# Patient Record
Sex: Female | Born: 1965 | Race: White | Hispanic: No | Smoking: Former smoker
Health system: Southern US, Community
[De-identification: ages and names within clinical notes are randomized; demographics above are authoritative.]

## PROBLEM LIST (undated history)

## (undated) DIAGNOSIS — G43909 Migraine, unspecified, not intractable, without status migrainosus: Secondary | ICD-10-CM

## (undated) DIAGNOSIS — B019 Varicella without complication: Secondary | ICD-10-CM

## (undated) DIAGNOSIS — F32A Depression, unspecified: Secondary | ICD-10-CM

## (undated) DIAGNOSIS — F419 Anxiety disorder, unspecified: Secondary | ICD-10-CM

## (undated) HISTORY — DX: Varicella without complication: B01.9

## (undated) HISTORY — PX: BACK SURGERY: SHX140

## (undated) HISTORY — DX: Depression, unspecified: F32.A

## (undated) HISTORY — DX: Migraine, unspecified, not intractable, without status migrainosus: G43.909

## (undated) HISTORY — DX: Anxiety disorder, unspecified: F41.9

## (undated) HISTORY — PX: WISDOM TOOTH EXTRACTION: SHX21

---

## 1988-01-18 HISTORY — PX: APPENDECTOMY: SHX54

## 1998-04-09 ENCOUNTER — Other Ambulatory Visit: Admission: RE | Admit: 1998-04-09 | Discharge: 1998-04-09 | Payer: Self-pay | Admitting: Family Medicine

## 1999-05-12 ENCOUNTER — Other Ambulatory Visit: Admission: RE | Admit: 1999-05-12 | Discharge: 1999-05-12 | Payer: Self-pay | Admitting: Family Medicine

## 2000-11-08 ENCOUNTER — Encounter: Payer: Self-pay | Admitting: Orthopedic Surgery

## 2000-11-08 ENCOUNTER — Encounter: Admission: RE | Admit: 2000-11-08 | Discharge: 2000-11-08 | Payer: Self-pay | Admitting: Orthopedic Surgery

## 2000-11-22 ENCOUNTER — Encounter: Admission: RE | Admit: 2000-11-22 | Discharge: 2000-11-22 | Payer: Self-pay | Admitting: Orthopedic Surgery

## 2000-11-22 ENCOUNTER — Encounter: Payer: Self-pay | Admitting: Orthopedic Surgery

## 2001-01-30 ENCOUNTER — Encounter: Payer: Self-pay | Admitting: Emergency Medicine

## 2001-01-30 ENCOUNTER — Emergency Department (HOSPITAL_COMMUNITY): Admission: EM | Admit: 2001-01-30 | Discharge: 2001-01-30 | Payer: Self-pay | Admitting: Emergency Medicine

## 2002-01-03 ENCOUNTER — Encounter: Admission: RE | Admit: 2002-01-03 | Discharge: 2002-01-03 | Payer: Self-pay | Admitting: Family Medicine

## 2002-01-03 ENCOUNTER — Encounter: Payer: Self-pay | Admitting: Family Medicine

## 2002-11-05 ENCOUNTER — Inpatient Hospital Stay (HOSPITAL_COMMUNITY): Admission: AD | Admit: 2002-11-05 | Discharge: 2002-11-07 | Payer: Self-pay | Admitting: Obstetrics

## 2002-11-05 ENCOUNTER — Inpatient Hospital Stay (HOSPITAL_COMMUNITY): Admission: AD | Admit: 2002-11-05 | Discharge: 2002-11-05 | Payer: Self-pay | Admitting: Obstetrics

## 2017-01-17 HISTORY — PX: FOOT FRACTURE SURGERY: SHX645

## 2017-10-24 ENCOUNTER — Other Ambulatory Visit: Payer: Self-pay | Admitting: Neurosurgery

## 2017-10-24 DIAGNOSIS — M5416 Radiculopathy, lumbar region: Secondary | ICD-10-CM

## 2017-11-02 ENCOUNTER — Ambulatory Visit
Admission: RE | Admit: 2017-11-02 | Discharge: 2017-11-02 | Disposition: A | Discharge status: Home / Self Care | Payer: 59 | Source: Ambulatory Visit | Attending: Neurosurgery | Admitting: Neurosurgery

## 2017-11-02 DIAGNOSIS — M5416 Radiculopathy, lumbar region: Secondary | ICD-10-CM

## 2017-11-02 MED ORDER — IOPAMIDOL (ISOVUE-M 200) INJECTION 41%
1.0000 mL | Freq: Once | INTRAMUSCULAR | Status: AC
  Administered 2017-11-02: 1 mL via EPIDURAL

## 2017-11-02 MED ORDER — METHYLPREDNISOLONE ACETATE 40 MG/ML INJ SUSP (RADIOLOG
120.0000 mg | Freq: Once | INTRAMUSCULAR | Status: AC
  Administered 2017-11-02: 120 mg via EPIDURAL

## 2017-11-02 NOTE — Discharge Instructions (Signed)

## 2019-08-22 ENCOUNTER — Telehealth: Payer: Self-pay | Admitting: General Practice

## 2019-08-22 NOTE — Telephone Encounter (Signed)
New Patient   Caller : Stefany    Per patient, her mother and family are established with you and she would like to establish with you.   Please advise

## 2019-08-23 NOTE — Telephone Encounter (Signed)
Ok

## 2019-08-30 NOTE — Telephone Encounter (Signed)
appt scheduled with pt

## 2019-10-23 ENCOUNTER — Ambulatory Visit: Payer: 59 | Admitting: Family

## 2019-10-28 ENCOUNTER — Other Ambulatory Visit: Payer: Self-pay

## 2019-10-28 ENCOUNTER — Encounter: Payer: Self-pay | Admitting: Family

## 2019-10-28 ENCOUNTER — Ambulatory Visit (INDEPENDENT_AMBULATORY_CARE_PROVIDER_SITE_OTHER): Payer: Managed Care, Other (non HMO) | Admitting: Family

## 2019-10-28 VITALS — BP 110/62 | HR 89 | Resp 16 | Ht 67.5 in | Wt 174.0 lb

## 2019-10-28 DIAGNOSIS — R5383 Other fatigue: Secondary | ICD-10-CM

## 2019-10-28 DIAGNOSIS — E538 Deficiency of other specified B group vitamins: Secondary | ICD-10-CM

## 2019-10-28 DIAGNOSIS — Z0000 Encounter for general adult medical examination without abnormal findings: Secondary | ICD-10-CM | POA: Diagnosis not present

## 2019-10-28 DIAGNOSIS — G43909 Migraine, unspecified, not intractable, without status migrainosus: Secondary | ICD-10-CM

## 2019-10-28 LAB — CBC WITH DIFFERENTIAL/PLATELET
Absolute Monocytes: 525 cells/uL (ref 200–950)
Basophils Absolute: 20 cells/uL (ref 0–200)
Basophils Relative: 0.4 %
Eosinophils Absolute: 170 cells/uL (ref 15–500)
Eosinophils Relative: 3.4 %
HCT: 38.1 % (ref 35.0–45.0)
Hemoglobin: 13.2 g/dL (ref 11.7–15.5)
Lymphs Abs: 1490 cells/uL (ref 850–3900)
MCH: 32.5 pg (ref 27.0–33.0)
MCHC: 34.6 g/dL (ref 32.0–36.0)
MCV: 93.8 fL (ref 80.0–100.0)
MPV: 10.3 fL (ref 7.5–12.5)
Monocytes Relative: 10.5 %
Neutro Abs: 2795 cells/uL (ref 1500–7800)
Neutrophils Relative %: 55.9 %
Platelets: 302 10*3/uL (ref 140–400)
RBC: 4.06 10*6/uL (ref 3.80–5.10)
RDW: 11.9 % (ref 11.0–15.0)
Total Lymphocyte: 29.8 %
WBC: 5 10*3/uL (ref 3.8–10.8)

## 2019-10-28 LAB — COMPREHENSIVE METABOLIC PANEL
AG Ratio: 1.7 (calc) (ref 1.0–2.5)
ALT: 19 U/L (ref 6–29)
AST: 16 U/L (ref 10–35)
Albumin: 4.2 g/dL (ref 3.6–5.1)
Alkaline phosphatase (APISO): 75 U/L (ref 37–153)
BUN: 16 mg/dL (ref 7–25)
CO2: 30 mmol/L (ref 20–32)
Calcium: 9.9 mg/dL (ref 8.6–10.4)
Chloride: 106 mmol/L (ref 98–110)
Creat: 0.83 mg/dL (ref 0.50–1.05)
Globulin: 2.5 g/dL (calc) (ref 1.9–3.7)
Glucose, Bld: 93 mg/dL (ref 65–99)
Potassium: 4.7 mmol/L (ref 3.5–5.3)
Sodium: 142 mmol/L (ref 135–146)
Total Bilirubin: 0.6 mg/dL (ref 0.2–1.2)
Total Protein: 6.7 g/dL (ref 6.1–8.1)

## 2019-10-28 LAB — TSH: TSH: 2.74 mIU/L

## 2019-10-28 LAB — VITAMIN B12: Vitamin B-12: 234 pg/mL (ref 200–1100)

## 2019-10-28 MED ORDER — SUMATRIPTAN SUCCINATE 50 MG PO TABS: ORAL_TABLET | ORAL | 5 refills | Status: DC

## 2019-10-28 NOTE — Progress Notes (Signed)
Subjective:    Patient ID: Susan Glover, female    DOB: 1965/03/05, 54 y.o.   MRN: [id number]  HPI  Patient is a 54 yr old female who presents today to establish care.    Pmhx is significant for the following.   b12 deficiency-  She has been on supplementation in the past but not recently.   Fatigue- she does c/o fatigue.   Migraines- States that her migraines coincide with her menstrual cycle. States that she is perimenopausal (LMP was 2 months ago).  When her headaches occur she often has right sided jaw pain, neck cramping, loss of vision in right eye and then migraine.  Uses 3 excedrin migraine and goes to bed. This usually helps.    Has never been prescribed a triptan for her migraines.  Anxiety/Depression-  Reports that when she was married the first time she developed depression.  Used to be treated with klonopin and xanax.  Reports mild depression symptoms.  No recent panic attacks. Has never been on daily meds for depression.   Review of Systems  Constitutional: Negative for unexpected weight change.  HENT: Negative for hearing loss and rhinorrhea.   Eyes: Negative for visual disturbance.  Respiratory: Negative for cough and shortness of breath.   Cardiovascular: Negative for chest pain.  Gastrointestinal: Positive for constipation (occasional). Negative for diarrhea.  Genitourinary: Negative for dysuria, frequency and hematuria.  Musculoskeletal: Negative for arthralgias and myalgias.  Skin: Negative for rash.  Neurological: Positive for headaches.  Hematological: Negative for adenopathy.  Psychiatric/Behavioral:       Reports mood/depression is stable.        Past Medical History:  Diagnosis Date   Chicken pox    Depression    Migraines      Social History   Socioeconomic History   Marital status: Divorced    Spouse name: Not on file   Number of children: Not on file   Years of education: Not on file   Highest education level: Not on file    Occupational History   Not on file  Tobacco Use   Smoking status: Former Smoker    Start date: 01/27/1985    Quit date: 01/28/2012    Years since quitting: 7.7   Smokeless tobacco: Never Used  Substance and Sexual Activity   Alcohol use: Yes    Comment: 1 drink/day   Drug use: Not Currently   Sexual activity: Yes    Partners: Male  Other Topics Concern   Not on file  Social History Narrative   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)   Has been married and divorced twice, lives with [name]   2 sons   2004- Will   2007- [name]   Completed some college   Enjoys gardening, games, reading   2 cats and 2 dogs, 2 crabs   Social Determinants of Radio broadcast assistant Strain:    Difficulty of Paying Living Expenses: Not on file  Food Insecurity:    Worried About Charity fundraiser in the Last Year: Not on file   [place] in the Last Year: Not on file  Transportation Needs:    Lack of Transportation (Medical): Not on file   Lack of Transportation (Non-Medical): Not on file  Physical Activity:    Days of Exercise per Week: Not on file   Minutes of Exercise per Session: Not on file  Stress:    Feeling of Stress : Not on file  Social Connections:    Frequency of Communication with Friends and Family: Not on file   Frequency of Social Gatherings with Friends and Family: Not on file   Attends Religious Services: Not on Electrical engineer or Organizations: Not on file   Attends Archivist Meetings: Not on file   Marital Status: Not on file  Intimate Partner Violence:    Fear of Current or Ex-Partner: Not on file   Emotionally Abused: Not on file   Physically Abused: Not on file   Sexually Abused: Not on file    Past Surgical History:  Procedure Laterality Date   [place]  2019    Family History  Problem Relation Age of Onset   Asthma Mother    Arthritis Mother     Depression Mother    Alcohol abuse Father    Heart disease Father        MI, brain aneurysm   Hyperlipidemia Father    COPD Father    Arthritis Sister    Depression Sister    Alcohol abuse Brother    Asthma Son    Depression Son    Arthritis Maternal Grandmother    Asthma Maternal Grandmother    COPD Maternal Grandmother    Alcohol abuse Maternal Grandfather    Asthma Maternal Grandfather    COPD Maternal Grandfather    Drug abuse Maternal Grandfather    Hyperlipidemia Maternal Grandfather    Hypertension Maternal Grandfather    Asthma Paternal Grandmother    COPD Paternal Grandmother    Heart attack Paternal Grandfather    Hyperlipidemia Paternal Grandfather    Hypertension Paternal Grandfather    Asthma Son    Depression Son     Allergies  Allergen Reactions   Poison Ivy Extract Dermatitis, Itching and Rash    Current Outpatient Medications on File Prior to Visit  Medication Sig Dispense Refill   aspirin-acetaminophen-caffeine (EXCEDRIN MIGRAINE) 250-250-65 MG tablet Take by mouth every 6 (six) hours as needed for headache.     cyclobenzaprine (FLEXERIL) 10 MG tablet Take 10 mg by mouth 3 (three) times daily.     meloxicam (MOBIC) 15 MG tablet Take 15 mg by mouth daily.     No current facility-administered medications on file prior to visit.    BP 110/62 (BP Location: Left Arm, Patient Position: Sitting, Cuff Size: Normal)    Pulse 89    Resp 16    Ht 5' 7.5" (1.715 m)    Wt 174 lb (78.9 kg)    SpO2 99%    BMI 26.85 kg/m    Objective:   Physical Exam Constitutional:      Appearance: She is well-developed.  HENT:     Right Ear: Tympanic membrane and ear canal normal.     Left Ear: Tympanic membrane and ear canal normal.  Neck:     Thyroid: No thyromegaly.  Cardiovascular:     Rate and Rhythm: Normal rate and regular rhythm.     Heart sounds: Normal heart sounds. No murmur heard.   Pulmonary:     Effort: Pulmonary effort is  normal. No respiratory distress.     Breath sounds: Normal breath sounds. No wheezing.  Musculoskeletal:     Cervical back: Neck supple.  Skin:    General: Skin is warm and dry.  Neurological:     Mental Status: She is alert and oriented to person, place, and time.  Psychiatric:  Behavior: Behavior normal.        Thought Content: Thought content normal.        Judgment: Judgment normal.           Assessment & Plan:  B12 deficiency- will check b12 level.  Fatigue- check CBC, TSH, CMET.  Refer to GI for screening colonoscopy.  Migraines- will give rx for imitrex prn.    36 minutes spent on today's visit.  Time was spent interviewing/examining patient and reviewing medical record.   This visit occurred during the SARS-CoV-2 public health emergency.  Safety protocols were in place, including screening questions prior to the visit, additional usage of staff PPE, and extensive cleaning of exam room while observing appropriate contact time as indicated for disinfecting solutions.

## 2019-10-28 NOTE — Patient Instructions (Signed)
Please complete lab work prior to leaving.  Welcome to [facility]! 

## 2019-10-29 ENCOUNTER — Encounter: Payer: Self-pay | Admitting: Gastroenterology

## 2019-10-29 ENCOUNTER — Telehealth: Payer: Self-pay | Admitting: Family

## 2019-10-29 ENCOUNTER — Encounter: Payer: Self-pay | Admitting: Family

## 2019-10-29 DIAGNOSIS — E538 Deficiency of other specified B group vitamins: Secondary | ICD-10-CM | POA: Insufficient documentation

## 2019-10-29 HISTORY — DX: Deficiency of other specified B group vitamins: E53.8

## 2019-10-29 NOTE — Telephone Encounter (Signed)
Patient advised of results and provider's advise. She was scheduled to come in for first B12 injection 11-05-19 for 1 of 4 weekly. She prefers to schedule the next one when she comes in that day.

## 2019-10-29 NOTE — Telephone Encounter (Signed)
Please advise pt that her b12 is low.  I would recommend that she start b12 injections 1014mcg once daily for 4 weeks, then once monthly.

## 2019-11-05 ENCOUNTER — Ambulatory Visit (INDEPENDENT_AMBULATORY_CARE_PROVIDER_SITE_OTHER): Payer: Managed Care, Other (non HMO)

## 2019-11-05 ENCOUNTER — Other Ambulatory Visit: Payer: Self-pay

## 2019-11-05 DIAGNOSIS — E538 Deficiency of other specified B group vitamins: Secondary | ICD-10-CM

## 2019-11-05 MED ORDER — CYANOCOBALAMIN 1000 MCG/ML IJ SOLN
1000.0000 ug | Freq: Once | INTRAMUSCULAR | Status: AC
  Administered 2019-11-05: 1000 ug via INTRAMUSCULAR

## 2019-11-05 NOTE — Progress Notes (Signed)
Pt here for weekly B12 injection 1 of 4   B12 1081mcg given IM left deltoid, and pt tolerated injection well.  Next B12 injection scheduled for next week

## 2019-11-12 ENCOUNTER — Other Ambulatory Visit: Payer: Self-pay

## 2019-11-12 ENCOUNTER — Ambulatory Visit (INDEPENDENT_AMBULATORY_CARE_PROVIDER_SITE_OTHER): Payer: Managed Care, Other (non HMO)

## 2019-11-12 DIAGNOSIS — E538 Deficiency of other specified B group vitamins: Secondary | ICD-10-CM

## 2019-11-12 MED ORDER — CYANOCOBALAMIN 1000 MCG/ML IJ SOLN
1000.0000 ug | Freq: Once | INTRAMUSCULAR | Status: AC
  Administered 2019-11-12: 1000 ug via INTRAMUSCULAR

## 2019-11-12 NOTE — Progress Notes (Signed)
Pt here for weekly  B12 injection per  B12 1037mcg given in right deltoid IM, and pt tolerated injection well.  Next B12 injection scheduled for next week.

## 2019-11-19 ENCOUNTER — Ambulatory Visit (INDEPENDENT_AMBULATORY_CARE_PROVIDER_SITE_OTHER): Payer: Managed Care, Other (non HMO)

## 2019-11-19 ENCOUNTER — Other Ambulatory Visit: Payer: Self-pay

## 2019-11-19 DIAGNOSIS — E538 Deficiency of other specified B group vitamins: Secondary | ICD-10-CM

## 2019-11-19 MED ORDER — CYANOCOBALAMIN 1000 MCG/ML IJ SOLN
1000.0000 ug | Freq: Once | INTRAMUSCULAR | Status: AC
  Administered 2019-11-19: 1000 ug via INTRAMUSCULAR

## 2019-11-19 NOTE — Progress Notes (Signed)
Pt here for monthly B12 injection per  [name]  B12 1020mcg given IM left deltoid, and pt tolerated injection well.  Next B12 injection scheduled for next week at CPE with pcp.

## 2019-11-26 ENCOUNTER — Other Ambulatory Visit (HOSPITAL_COMMUNITY)
Admission: RE | Admit: 2019-11-26 | Discharge: 2019-11-26 | Disposition: A | Discharge status: Home / Self Care | Payer: Managed Care, Other (non HMO) | Source: Ambulatory Visit | Attending: Family | Admitting: Family

## 2019-11-26 ENCOUNTER — Ambulatory Visit (INDEPENDENT_AMBULATORY_CARE_PROVIDER_SITE_OTHER): Payer: Managed Care, Other (non HMO) | Admitting: Family

## 2019-11-26 ENCOUNTER — Encounter: Payer: Self-pay | Admitting: Family

## 2019-11-26 ENCOUNTER — Other Ambulatory Visit: Payer: Self-pay

## 2019-11-26 VITALS — BP 127/82 | HR 82 | Temp 98.3°F | Resp 16 | Ht 67.5 in | Wt 179.0 lb

## 2019-11-26 DIAGNOSIS — Z0000 Encounter for general adult medical examination without abnormal findings: Secondary | ICD-10-CM | POA: Diagnosis not present

## 2019-11-26 DIAGNOSIS — Z01419 Encounter for gynecological examination (general) (routine) without abnormal findings: Secondary | ICD-10-CM | POA: Insufficient documentation

## 2019-11-26 DIAGNOSIS — Z23 Encounter for immunization: Secondary | ICD-10-CM

## 2019-11-26 DIAGNOSIS — E538 Deficiency of other specified B group vitamins: Secondary | ICD-10-CM

## 2019-11-26 DIAGNOSIS — G43919 Migraine, unspecified, intractable, without status migrainosus: Secondary | ICD-10-CM | POA: Diagnosis not present

## 2019-11-26 MED ORDER — CYANOCOBALAMIN 1000 MCG/ML IJ SOLN
1000.0000 ug | Freq: Once | INTRAMUSCULAR | Status: AC
  Administered 2019-11-26: 1000 ug via INTRAMUSCULAR

## 2019-11-26 NOTE — Progress Notes (Signed)
Subjective:    Patient ID: Susan Glover, female    DOB: [birth year], 54 y.o.   MRN: [id number]  HPI  Patient presents today for complete physical.  Immunizations: flu shot today, unsure of last tetanus shot, declines.  She had pfizer x 2. Last dose in April.   Diet: reports diet is healthy Exercise:  Exercises regularly- pickleball 2-3 times a week, active around the house  Colonoscopy: due- referral was placed last visit.  Pap Smear: due Mammogram: due Vision: up to date (had lasik at the beginning of the year) Dental: up to date  Last visit she noted fatigue. B12 was low normal, CMET, CBC and TSH were normal.    Migraines- last visit we gave her an trial of prn Imitrex. Had one migraine since last visit which was resolved by imitrex.   B12 deficiency- she has been receiving monthly b12 shots.    She has spinal stenosis/sciatica and bulging discs.  Had a ESI injection 1 month ago.  Seeing Dr. Annette Stable and Brien Few.   Review of Systems  Constitutional: Negative for unexpected weight change.  HENT: Negative for hearing loss and rhinorrhea.   Eyes: Negative for visual disturbance.  Respiratory: Negative for cough and shortness of breath.   Cardiovascular: Negative for leg swelling.  Gastrointestinal: Positive for constipation (occasional, stable recently. ). Negative for diarrhea.  Genitourinary: Negative for dysuria, frequency, hematuria and menstrual problem (LMP 3 months ago).  Musculoskeletal: Positive for back pain. Negative for arthralgias and joint swelling.  Skin: Negative for rash.  Neurological: Positive for headaches (occasional).  Hematological: Negative for adenopathy.  Psychiatric/Behavioral:       Reports occasionally feels down but she is doing OK overall   Past Medical History:  Diagnosis Date  . B12 deficiency 10/29/2019  . Chicken pox   . Depression   . Migraines      Social History   Socioeconomic History  . Marital status: Divorced    Spouse name:  Not on file  . Number of children: Not on file  . Years of education: Not on file  . Highest education level: Not on file  Occupational History  . Not on file  Tobacco Use  . Smoking status: Former Smoker    Start date: 01/27/1985    Quit date: 01/28/2012    Years since quitting: 7.8  . Smokeless tobacco: Never Used  Substance and Sexual Activity  . Alcohol use: Yes    Comment: 1 drink/day  . Drug use: Not Currently  . Sexual activity: Yes    Partners: Male  Other Topics Concern  . 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 Difficulty of Paying Living Expenses: Not on file  Food Insecurity:   . Worried About Charity fundraiser in the Last Year: Not on file  . Ran Out of Food in the Last Year: Not on file  Transportation Needs:   . Lack of Transportation (Medical): Not on file  . Lack of Transportation (Non-Medical): Not on file  Physical Activity:   . Days of Exercise per Week: Not on file  . Minutes of Exercise per  Session: Not on file  Stress:   . Feeling of Stress : Not on file  Social Connections:   . Frequency of Communication with Friends and Family: Not on file  . Frequency of Social Gatherings with Friends and Family: Not on file  . Attends Religious Services: Not on file  . Active Member of Clubs or Organizations: Not on file  . Attends Archivist Meetings: Not on file  . Marital Status: Not on file  Intimate Partner Violence:   . Fear of Current or Ex-Partner: Not on file  . Emotionally Abused: Not on file  . Physically Abused: Not on file  . Sexually Abused: Not on file    Past Surgical History:  Procedure Laterality Date  . APPENDECTOMY  1990  . FOOT  FRACTURE SURGERY  2019    Family History  Problem Relation Age of Onset  . Asthma Mother   . Arthritis Mother   . Depression Mother   . Alcohol abuse Father   . Heart disease Father        MI, brain aneurysm  . Hyperlipidemia Father   . COPD Father   . Arthritis Sister   . Depression Sister   . Alcohol abuse Brother   . Asthma Son   . Depression Son   . Arthritis Maternal Grandmother   . Asthma Maternal Grandmother   . COPD Maternal Grandmother   . Alcohol abuse Maternal Grandfather   . Asthma Maternal Grandfather   . COPD Maternal Grandfather   . Drug abuse Maternal Grandfather   . Hyperlipidemia Maternal Grandfather   . Hypertension Maternal Grandfather   . Asthma Paternal Grandmother   . COPD Paternal Grandmother   . Heart attack Paternal Grandfather   . Hyperlipidemia Paternal Grandfather   . Hypertension Paternal Grandfather   . Asthma Son   . Depression Son     Allergies  Allergen Reactions  . Poison Ivy Extract Dermatitis, Itching and Rash    Current Outpatient Medications on File Prior to Visit  Medication Sig Dispense Refill  . meloxicam (MOBIC) 15 MG tablet Take 15 mg by mouth daily.    . SUMAtriptan (IMITREX) 50 MG tablet Take 1 tab by mouth at start of migraine- may repeat in 2 hrs as needed. 10 tablet 5  . aspirin-acetaminophen-caffeine (EXCEDRIN MIGRAINE) 536-644-03 MG tablet Take by mouth every 6 (six) hours as needed for headache.     No current facility-administered medications on file prior to visit.    BP 127/82 (BP Location: Right Arm, Patient Position: Sitting, Cuff Size: Small)   Pulse 82   Temp 98.3 F (36.8 C) (Oral)   Resp 16   Ht 5' 7.5" (1.715 m)   Wt 179 lb (81.2 kg)   SpO2 100%   BMI 27.62 kg/m       Objective:   Physical Exam  Physical Exam  Constitutional: She is oriented to person, place, and time. She appears well-developed and well-nourished. No distress.  HENT:  Head: Normocephalic and atraumatic.  Right Ear:  Tympanic membrane and ear canal normal.  Left Ear: Tympanic membrane and ear canal normal.  Mouth/Throat: not examined- pt wearing mask.  Eyes: Pupils are equal, round, and reactive to light. No scleral icterus.  Neck: Normal range of motion. No thyromegaly present.  Cardiovascular: Normal rate and regular rhythm.   No murmur heard. Pulmonary/Chest: Effort normal and breath sounds normal. No respiratory distress. He has no wheezes. She has no rales. She exhibits no tenderness.  Abdominal: Soft. Bowel sounds are normal. She exhibits no distension and no mass. There is no tenderness. There is no rebound and no guarding.  Musculoskeletal: She exhibits no edema.  Lymphadenopathy:    She has no cervical adenopathy.  Neurological: She is alert and oriented to person, place, and time. She has normal patellar reflexes. She exhibits normal muscle tone. Coordination normal.  Skin: Skin is warm and dry.  Psychiatric: She has a normal mood and affect. Her behavior is normal. Judgment and thought content normal.  Breasts: Examined lying Right: Without masses, retractions, discharge or axillary adenopathy.  Left: Without masses, retractions, discharge or axillary adenopathy.  Inguinal/mons: Normal without inguinal adenopathy  External genitalia: Normal  BUS/Urethra/Skene's glands: Normal  Bladder: Normal  Vagina: Normal  Cervix: Normal  Uterus: normal in size, shape and contour. Midline and mobile  Adnexa/parametria:  Rt: Without masses or tenderness.  Lt: Without masses or tenderness.  Anus and perineum: Normal            Assessment & Plan:   Preventative care- pap performed.  Mammo ordered.  Declines tetanus shot today. Flu shot today. Colo is due- referral in system.   B12 deficiency- b12 injection today. Continue monthly b12 1063mcg injections.  Migraines- stable. Good response to prn imitrex 50mg . Continue same.   This visit occurred during the SARS-CoV-2 public health emergency.   Safety protocols were in place, including screening questions prior to the visit, additional usage of staff PPE, and extensive cleaning of exam room while observing appropriate contact time as indicated for disinfecting solutions.         Assessment & Plan:

## 2019-11-26 NOTE — Patient Instructions (Addendum)
Please complete lab work prior to leaving. Continue to work on healthy diet, exercise and weight loss.  

## 2019-11-27 LAB — CYTOLOGY - PAP
Adequacy: ABSENT
Comment: NEGATIVE
Diagnosis: NEGATIVE
High risk HPV: NEGATIVE

## 2019-11-29 ENCOUNTER — Encounter: Payer: Self-pay | Admitting: Family

## 2019-12-04 NOTE — Progress Notes (Signed)
Mailed out to patient 

## 2019-12-17 ENCOUNTER — Other Ambulatory Visit: Payer: Self-pay

## 2019-12-17 ENCOUNTER — Ambulatory Visit (AMBULATORY_SURGERY_CENTER): Payer: Self-pay

## 2019-12-17 VITALS — Ht 67.5 in | Wt 178.0 lb

## 2019-12-17 DIAGNOSIS — Z1211 Encounter for screening for malignant neoplasm of colon: Secondary | ICD-10-CM

## 2019-12-17 MED ORDER — PLENVU 140 G PO SOLR: 1.0000 | ORAL | 0 refills | Status: DC

## 2019-12-17 NOTE — Progress Notes (Signed)
No egg or soy allergy known to patient  No issues with past sedation with any surgeries or procedures No intubation problems in the past  No FH of Malignant Hyperthermia No diet pills per patient No home 02 use per patient  No blood thinners per patient  Pt denies issues with constipation  No A fib or A flutter  COVID 19 guidelines implemented in [facility] today with Pt and RN  Coupon given to pt in [facility] today , Code to Pharmacy  COVID vaccines completed on 05/2019 per pt;  Due to the COVID-19 pandemic we are asking patients to follow these guidelines. Please only bring one care partner. Please be aware that your care partner may wait in the car in the parking lot or if they feel like they will be too hot to wait in the car, they may wait in the lobby on the 4th floor. All care partners are required to wear a mask the entire time (we do not have any that we can provide them), they need to practice social distancing, and we will do a Covid check for all patient's and care partners when you arrive. Also we will check their temperature and your temperature. If the care partner waits in their car they need to stay in the parking lot the entire time and we will call them on their cell phone when the patient is ready for discharge so they can bring the car to the front of the building. Also all patient's will need to wear a mask into building.

## 2019-12-19 ENCOUNTER — Encounter: Payer: Self-pay | Admitting: Gastroenterology

## 2019-12-25 ENCOUNTER — Other Ambulatory Visit: Payer: Self-pay

## 2019-12-25 ENCOUNTER — Ambulatory Visit (INDEPENDENT_AMBULATORY_CARE_PROVIDER_SITE_OTHER): Payer: Managed Care, Other (non HMO)

## 2019-12-25 DIAGNOSIS — E538 Deficiency of other specified B group vitamins: Secondary | ICD-10-CM

## 2019-12-25 MED ORDER — CYANOCOBALAMIN 1000 MCG/ML IJ SOLN
1000.0000 ug | Freq: Once | INTRAMUSCULAR | Status: AC
  Administered 2019-12-25: 1000 ug via INTRAMUSCULAR

## 2019-12-25 NOTE — Progress Notes (Signed)
Pt here for monthly B12 injection per   B12 1075mcg given IM, and pt tolerated injection well.  Next B12 injection scheduled for 01/22/20

## 2019-12-26 ENCOUNTER — Ambulatory Visit: Payer: Managed Care, Other (non HMO)

## 2020-01-03 ENCOUNTER — Encounter: Payer: Self-pay | Admitting: Gastroenterology

## 2020-01-03 ENCOUNTER — Other Ambulatory Visit: Payer: Self-pay

## 2020-01-03 ENCOUNTER — Ambulatory Visit (AMBULATORY_SURGERY_CENTER): Payer: Managed Care, Other (non HMO) | Admitting: Gastroenterology

## 2020-01-03 VITALS — BP 123/80 | HR 82 | Temp 97.3°F | Resp 20 | Ht 67.5 in | Wt 178.0 lb

## 2020-01-03 DIAGNOSIS — K640 First degree hemorrhoids: Secondary | ICD-10-CM

## 2020-01-03 DIAGNOSIS — Z1211 Encounter for screening for malignant neoplasm of colon: Secondary | ICD-10-CM | POA: Diagnosis present

## 2020-01-03 DIAGNOSIS — D129 Benign neoplasm of anus and anal canal: Secondary | ICD-10-CM

## 2020-01-03 DIAGNOSIS — K621 Rectal polyp: Secondary | ICD-10-CM

## 2020-01-03 MED ORDER — SODIUM CHLORIDE 0.9 % IV SOLN: 500.0000 mL | Freq: Once | INTRAVENOUS | Status: DC

## 2020-01-03 NOTE — Progress Notes (Signed)
VS by CW.  previsit with BG.  Pt states no changes to health hx since previsit 

## 2020-01-03 NOTE — Patient Instructions (Signed)
Discharge instructions given. Handouts on polyps and hemorrhoids. Resume previous medications. YOU HAD AN ENDOSCOPIC PROCEDURE TODAY AT THE [facility]:   Refer to the procedure report that was given to you for any specific questions about what was found during the examination.  If the procedure report does not answer your questions, please call your gastroenterologist to clarify.  If you requested that your care partner not be given the details of your procedure findings, then the procedure report has been included in a sealed envelope for you to review at your convenience later.  YOU SHOULD EXPECT: Some feelings of bloating in the abdomen. Passage of more gas than usual.  Walking can help get rid of the air that was put into your GI tract during the procedure and reduce the bloating. If you had a lower endoscopy (such as a colonoscopy or flexible sigmoidoscopy) you may notice spotting of blood in your stool or on the toilet paper. If you underwent a bowel prep for your procedure, you may not have a normal bowel movement for a few days.  Please Note:  You might notice some irritation and congestion in your nose or some drainage.  This is from the oxygen used during your procedure.  There is no need for concern and it should clear up in a day or so.  SYMPTOMS TO REPORT IMMEDIATELY:  Following lower endoscopy (colonoscopy or flexible sigmoidoscopy):  Excessive amounts of blood in the stool  Significant tenderness or worsening of abdominal pains  Swelling of the abdomen that is new, acute  Fever of 100F or higher   For urgent or emergent issues, a gastroenterologist can be reached at any hour by calling [phone]. Do not use MyChart messaging for urgent concerns.    DIET:  We do recommend a small meal at first, but then you may proceed to your regular diet.  Drink plenty of fluids but you should avoid alcoholic beverages for 24 hours.  ACTIVITY:  You should plan to take it  easy for the rest of today and you should NOT DRIVE or use heavy machinery until tomorrow (because of the sedation medicines used during the test).    FOLLOW UP: Our staff will call the number listed on your records 48-72 hours following your procedure to check on you and address any questions or concerns that you may have regarding the information given to you following your procedure. If we do not reach you, we will leave a message.  We will attempt to reach you two times.  During this call, we will ask if you have developed any symptoms of COVID 19. If you develop any symptoms (ie: fever, flu-like symptoms, shortness of breath, cough etc.) before then, please call [phone].  If you test positive for Covid 19 in the 2 weeks post procedure, please call and report this information to us.    If any biopsies were taken you will be contacted by phone or by letter within the next 1-3 weeks.  Please call us at [phone] if you have not heard about the biopsies in 3 weeks.    SIGNATURES/CONFIDENTIALITY: You and/or your care partner have signed paperwork which will be entered into your electronic medical record.  These signatures attest to the fact that that the information above on your After Visit Summary has been reviewed and is understood.  Full responsibility of the confidentiality of this discharge information lies with you and/or your care-partner.  

## 2020-01-03 NOTE — Op Note (Signed)
[facility] Patient Name: Susan Glover Procedure Date: 01/03/2020 1:25 PM MRN: [id number] Endoscopist: Gerrit Heck , MD Age: 54 Referring MD:  Date of Birth: 11/09/1965 Gender: Female Account #: [id number] Procedure:                Colonoscopy Indications:              Screening for colorectal malignant neoplasm, This                            is the patient's first colonoscopy Medicines:                Monitored Anesthesia Care Procedure:                Pre-Anesthesia Assessment:                           - Prior to the procedure, a History and Physical                            was performed, and patient medications and                            allergies were reviewed. The patient's tolerance of                            previous anesthesia was also reviewed. The risks                            and benefits of the procedure and the sedation                            options and risks were discussed with the patient.                            All questions were answered, and informed consent                            was obtained. Prior Anticoagulants: The patient has                            taken no previous anticoagulant or antiplatelet                            agents. ASA Grade Assessment: II - A patient with                            mild systemic disease. After reviewing the risks                            and benefits, the patient was deemed in                            satisfactory condition to undergo the procedure.  After obtaining informed consent, the colonoscope                            was passed under direct vision. Throughout the                            procedure, the patient's blood pressure, pulse, and                            oxygen saturations were monitored continuously. The                            Olympus CF-HQ190L ([id number]) Colonoscope was                            introduced through the anus  and advanced to the the                            cecum, identified by appendiceal orifice and                            ileocecal valve. The colonoscopy was technically                            difficult and complex due to a tortuous colon.                            Successful completion of the procedure was aided by                            applying abdominal pressure. The patient tolerated                            the procedure well. The quality of the bowel                            preparation was excellent. The ileocecal valve,                            appendiceal orifice, and rectum were photographed. Scope In: 1:57:27 PM Scope Out: 2:16:13 PM Scope Withdrawal Time: 0 hours 11 minutes 49 seconds  Total Procedure Duration: 0 hours 18 minutes 46 seconds  Findings:                 The perianal and digital rectal examinations were                            normal.                           Three sessile polyps were found in the rectum and                            recto-sigmoid colon. The polyps were 2 to 3 mm in  size. These polyps were removed with a cold snare.                            Resection and retrieval were complete. Estimated                            blood loss was minimal.                           Non-bleeding internal hemorrhoids were found during                            retroflexion. The hemorrhoids were small and Grade                            I (internal hemorrhoids that do not prolapse).                           The exam was otherwise normal throughout the                            remainder of the colon. Complications:            No immediate complications. Estimated Blood Loss:     Estimated blood loss was minimal. Impression:               - Three 2 to 3 mm polyps in the rectum and at the                            recto-sigmoid colon, removed with a cold snare.                            Resected and retrieved.                            - Non-bleeding internal hemorrhoids. Recommendation:           - Patient has a contact number available for                            emergencies. The signs and symptoms of potential                            delayed complications were discussed with the                            patient. Return to normal activities tomorrow.                            Written discharge instructions were provided to the                            patient.                           - Resume previous diet.                           -  Continue present medications.                           - Await pathology results.                           - Repeat colonoscopy for surveillance based on                            pathology results.                           - Return to GI office PRN. Gerrit Heck, MD 01/03/2020 2:20:42 PM

## 2020-01-03 NOTE — Progress Notes (Signed)
Called to room to assist during endoscopic procedure.  Patient ID and intended procedure confirmed with present staff. Received instructions for my participation in the procedure from the performing physician.  

## 2020-01-03 NOTE — Progress Notes (Signed)
A and O x3. Report to RN. Tolerated MAC anesthesia well.

## 2020-01-07 ENCOUNTER — Telehealth: Payer: Self-pay | Admitting: *Deleted

## 2020-01-07 NOTE — Telephone Encounter (Signed)
°  Follow up Call-  Call back number 01/03/2020  Post procedure Call Back phone  # [phone]  Permission to leave phone message Yes  Some recent data might be hidden     Patient questions:  Do you have a fever, pain , or abdominal swelling? No. Pain Score  0 *  Have you tolerated food without any problems? Yes.    Have you been able to return to your normal activities? Yes.    Do you have any questions about your discharge instructions: Diet   No. Medications  No. Follow up visit  No.  Do you have questions or concerns about your Care? No.  Actions: * If pain score is 4 or above: No action needed, pain <4.  1. Have you developed a fever since your procedure?no  2.   Have you had an respiratory symptoms (SOB or cough) since your procedure? no  3.   Have you tested positive for COVID 19 since your procedure no  4.   Have you had any family members/close contacts diagnosed with the COVID 19 since your procedure? no   If yes to any of these questions please route to Joylene John, RN and Joella Prince, RN

## 2020-01-22 ENCOUNTER — Ambulatory Visit (INDEPENDENT_AMBULATORY_CARE_PROVIDER_SITE_OTHER): Payer: Managed Care, Other (non HMO)

## 2020-01-22 ENCOUNTER — Other Ambulatory Visit: Payer: Self-pay

## 2020-01-22 ENCOUNTER — Encounter: Payer: Self-pay | Admitting: Gastroenterology

## 2020-01-22 ENCOUNTER — Encounter: Payer: Self-pay | Admitting: Family

## 2020-01-22 DIAGNOSIS — E538 Deficiency of other specified B group vitamins: Secondary | ICD-10-CM

## 2020-01-22 MED ORDER — CYANOCOBALAMIN 1000 MCG/ML IJ SOLN
1000.0000 ug | Freq: Once | INTRAMUSCULAR | Status: AC
  Administered 2020-01-22: 1000 ug via INTRAMUSCULAR

## 2020-01-22 NOTE — Progress Notes (Signed)
Pt is here today for B12 injection. Pt was given B12 injection in left deltoid. Pt tolerated well

## 2020-01-27 ENCOUNTER — Other Ambulatory Visit: Payer: Self-pay

## 2020-01-28 ENCOUNTER — Other Ambulatory Visit: Payer: Self-pay

## 2020-01-28 ENCOUNTER — Encounter: Payer: Self-pay | Admitting: Family

## 2020-01-28 ENCOUNTER — Ambulatory Visit (INDEPENDENT_AMBULATORY_CARE_PROVIDER_SITE_OTHER): Payer: Managed Care, Other (non HMO) | Admitting: Family

## 2020-01-28 VITALS — BP 131/75 | HR 67 | Temp 98.3°F | Resp 16 | Ht 67.5 in | Wt 178.0 lb

## 2020-01-28 DIAGNOSIS — G8929 Other chronic pain: Secondary | ICD-10-CM | POA: Diagnosis not present

## 2020-01-28 DIAGNOSIS — E663 Overweight: Secondary | ICD-10-CM

## 2020-01-28 DIAGNOSIS — Z6827 Body mass index (BMI) 27.0-27.9, adult: Secondary | ICD-10-CM

## 2020-01-28 DIAGNOSIS — M5440 Lumbago with sciatica, unspecified side: Secondary | ICD-10-CM

## 2020-01-28 NOTE — Progress Notes (Signed)
Subjective:    Patient ID: Susan Glover, female    DOB: 03/16/1965, 55 y.o.   MRN: [id number]  HPI  Patient is a 55 yr old female who presents today with two concerns:  Weight gain- She reports that she is very frustrated with her inability to lose weight. Wt Readings from Last 3 Encounters:  01/28/20 178 lb (80.7 kg)  01/03/20 178 lb (80.7 kg)  12/17/19 178 lb (80.7 kg)   She is working with neurosurgery for her sciatica (has an MRI and also is doing some massage, chiropractor, ESI).  She is not getting a lot of relief which she is frustrated by.  We reviewed her typical diet:  Snacks, apple, oranges, smart pop, switching pasta for zuchinni noodles.  Occasional sodas. No juices.    AM- coffee , apple cinnamon oatmeal (today had special K no milk), occasional ham biscuit, omelet on the weekends  Occasional AM snack (cucumbers/popcorn/oranges)  Lunch- eats out once a week (sushi or chipotle- no rice) Usually brings her lunch to work, turkey/swiss on wheat bread occasionally mayo. Cucumbers/apple/oranges/grapes  Dinner- last night clam chowder.  Had chicken enchiladas the night before that.  Had wings the night before that .  Usually pretty healthy (Chicken/pork/vegetables).  Evening snacks so delicious caramel ice cream (non-dairy) small saucer.   Exercise- pickle ball 3 times a week, stretches in the AM's.  Used to run 3.5 miles a day (5 years ago).   Drinks 1 hard lemonade 3 days a week  Review of Systems Past Medical History:  Diagnosis Date  . Anxiety    hx of  . B12 deficiency 10/29/2019  . Chicken pox   . Depression    hx of  . Migraines      Social History   Socioeconomic History  . Marital status: Divorced    Spouse name: Not on file  . Number of children: Not on file  . Years of education: Not on file  . Highest education level: Not on file  Occupational History  . Not on file  Tobacco Use  . Smoking status: Former Smoker    Start date: 01/27/1985     Quit date: 01/28/2012    Years since quitting: 8.0  . Smokeless tobacco: Never Used  Vaping Use  . Vaping Use: Never used  Substance and Sexual Activity  . Alcohol use: Yes    Alcohol/week: 7.0 standard drinks    Types: 7 Standard drinks or equivalent per week    Comment: 1 drink/day  . Drug use: Not Currently  . Sexual activity: Yes    Partners: Male  Other Topics Concern  . 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 APPENDECTOMY  1990  . FOOT FRACTURE SURGERY Bilateral 2019  . WISDOM TOOTH EXTRACTION      Family History  Problem Relation Age of  Onset  . Asthma Mother   . Arthritis Mother   . Depression Mother   . Colon polyps Mother 6  . Alcohol abuse Father   . Heart disease Father        MI, brain aneurysm  . Hyperlipidemia Father   . COPD Father   . Arthritis Sister   . Depression Sister   . Colon polyps Sister 5  . Alcohol abuse Brother   . Asthma Son   . Depression Son   . Arthritis Maternal Grandmother   . Asthma Maternal Grandmother   . COPD Maternal Grandmother   . Alcohol abuse Maternal Grandfather   . Asthma Maternal Grandfather   . COPD Maternal Grandfather   . Drug abuse Maternal Grandfather   . Hyperlipidemia Maternal Grandfather   . Hypertension Maternal Grandfather   . Asthma Paternal Grandmother   . COPD Paternal Grandmother   . Heart attack Paternal Grandfather   . Hyperlipidemia Paternal  Grandfather   . Hypertension Paternal Grandfather   . Asthma Son   . Depression Son   . Colon cancer Neg Hx   . Esophageal cancer Neg Hx   . Rectal cancer Neg Hx   . Stomach cancer Neg Hx     Allergies  Allergen Reactions  . Poison Ivy Extract Dermatitis, Itching and Rash    Current Outpatient Medications on File Prior to Visit  Medication Sig Dispense Refill  . meloxicam (MOBIC) 15 MG tablet Take 15 mg by mouth daily.    . SUMAtriptan (IMITREX) 50 MG tablet Take 1 tab by mouth at start of migraine- may repeat in 2 hrs as needed. 10 tablet 5   No current facility-administered medications on file prior to visit.    BP 131/75 (BP Location: Right Arm, Patient Position: Sitting, Cuff Size: Small)   Pulse 67   Temp 98.3 F (36.8 C) (Oral)   Resp 16   Ht 5' 7.5" (1.715 m)   Wt 178 lb (80.7 kg)   SpO2 99%   BMI 27.47 kg/m       Objective:   Physical Exam Constitutional:      Appearance: She is well-developed and well-nourished.  HENT:     Head: Normocephalic and atraumatic.  Neck:     Thyroid: No thyromegaly.  Pulmonary:     Effort: Pulmonary effort is normal.  Musculoskeletal:     Cervical back: Neck supple.  Neurological:     Mental Status: She is alert and oriented to person, place, and time.  Psychiatric:        Attention and Perception: Attention normal.        Mood and Affect: Mood and affect, mood and affect normal.        Speech: Speech normal.        Behavior: Behavior normal.        Thought Content: Thought content normal.        Judgment: Judgment normal.           Assessment & Plan:  Overweight- we discussed cutting back on snacking, try changing hard lemonade to Truly which has less sugar/calories. Add 2 more days of exercise each week.  Try to incorporate more lean protein and less carbs in her breakfast- such as greek yogurt.  We discussed various weight loss medications- but given her BMI of 27  I think we would have a hard time getting  insurance to cover them.    Chronic back pain- uncontrolled. She is hopeful that weight loss  may help her back pain.  She is currently on meloxicam. She has an upcoming MRI scheduled.  Will defer management to neurosurgery.  40 minutes spent on today's visit. The majority of this time was spent counseling the patient on diet/exercise and weight loss.  This visit occurred during the SARS-CoV-2 public health emergency.  Safety protocols were in place, including screening questions prior to the visit, additional usage of staff PPE, and extensive cleaning of exam room while observing appropriate contact time as indicated for disinfecting solutions.

## 2020-02-25 ENCOUNTER — Encounter: Payer: Self-pay | Admitting: Family

## 2020-02-25 ENCOUNTER — Ambulatory Visit: Payer: Managed Care, Other (non HMO)

## 2020-02-25 ENCOUNTER — Ambulatory Visit (INDEPENDENT_AMBULATORY_CARE_PROVIDER_SITE_OTHER): Payer: Managed Care, Other (non HMO)

## 2020-02-25 ENCOUNTER — Other Ambulatory Visit: Payer: Self-pay

## 2020-02-25 DIAGNOSIS — E538 Deficiency of other specified B group vitamins: Secondary | ICD-10-CM | POA: Diagnosis not present

## 2020-02-25 MED ORDER — CYANOCOBALAMIN 1000 MCG/ML IJ SOLN
1000.0000 ug | Freq: Once | INTRAMUSCULAR | Status: AC
  Administered 2020-02-25: 1000 ug via INTRAMUSCULAR

## 2020-02-25 NOTE — Progress Notes (Signed)
Pt here for monthly B12 injection per [name]  B12 1012mcg given IM, and pt tolerated injection well.  Next B12 injection scheduled for next month.   Patient would like to make pcp aware in case you didn't know-she is having back surgery at [facility] on 03/04/2020

## 2020-03-24 ENCOUNTER — Ambulatory Visit (INDEPENDENT_AMBULATORY_CARE_PROVIDER_SITE_OTHER): Payer: Managed Care, Other (non HMO) | Admitting: *Deleted

## 2020-03-24 ENCOUNTER — Other Ambulatory Visit: Payer: Self-pay

## 2020-03-24 DIAGNOSIS — E538 Deficiency of other specified B group vitamins: Secondary | ICD-10-CM | POA: Diagnosis not present

## 2020-03-24 MED ORDER — CYANOCOBALAMIN 1000 MCG/ML IJ SOLN
1000.0000 ug | Freq: Once | INTRAMUSCULAR | Status: AC
  Administered 2020-03-24: 1000 ug via INTRAMUSCULAR

## 2020-03-24 NOTE — Patient Instructions (Addendum)
Pt here for monthly B12 injection per [name]  B12 1037mcg given Right Deltoid IM, and pt tolerated injection well.  Next B12 injection scheduled for next month.

## 2020-03-24 NOTE — Progress Notes (Signed)
Pt here for monthly B12 injection per [name]  B12 1032mcg given Right Deltoid IM, and pt tolerated injection well.  Next B12 injection scheduled for next month.

## 2020-04-13 ENCOUNTER — Ambulatory Visit (HOSPITAL_BASED_OUTPATIENT_CLINIC_OR_DEPARTMENT_OTHER): Payer: Managed Care, Other (non HMO)

## 2020-04-24 ENCOUNTER — Ambulatory Visit (INDEPENDENT_AMBULATORY_CARE_PROVIDER_SITE_OTHER): Payer: Managed Care, Other (non HMO)

## 2020-04-24 ENCOUNTER — Other Ambulatory Visit: Payer: Self-pay

## 2020-04-24 DIAGNOSIS — E538 Deficiency of other specified B group vitamins: Secondary | ICD-10-CM

## 2020-04-24 MED ORDER — CYANOCOBALAMIN 1000 MCG/ML IJ SOLN
1000.0000 ug | Freq: Once | INTRAMUSCULAR | Status: AC
  Administered 2020-04-24: 1000 ug via INTRAMUSCULAR

## 2020-04-24 NOTE — Progress Notes (Signed)
Pt here for monthly B12 injection per [name] , NP   B12 1043mcg given right  IM, and pt tolerated injection well.  Next B12 injection scheduled for next month

## 2020-05-26 ENCOUNTER — Ambulatory Visit: Payer: Managed Care, Other (non HMO)

## 2020-06-17 ENCOUNTER — Emergency Department (HOSPITAL_BASED_OUTPATIENT_CLINIC_OR_DEPARTMENT_OTHER)
Admission: EM | Admit: 2020-06-17 | Discharge: 2020-06-17 | Disposition: A | Discharge status: Home / Self Care | Payer: Managed Care, Other (non HMO) | Attending: Emergency Medicine | Admitting: Emergency Medicine

## 2020-06-17 ENCOUNTER — Emergency Department (HOSPITAL_BASED_OUTPATIENT_CLINIC_OR_DEPARTMENT_OTHER): Payer: Managed Care, Other (non HMO)

## 2020-06-17 ENCOUNTER — Encounter (HOSPITAL_BASED_OUTPATIENT_CLINIC_OR_DEPARTMENT_OTHER): Payer: Self-pay | Admitting: Urology

## 2020-06-17 ENCOUNTER — Other Ambulatory Visit: Payer: Self-pay

## 2020-06-17 DIAGNOSIS — Z20822 Contact with and (suspected) exposure to covid-19: Secondary | ICD-10-CM | POA: Insufficient documentation

## 2020-06-17 DIAGNOSIS — R110 Nausea: Secondary | ICD-10-CM | POA: Diagnosis not present

## 2020-06-17 DIAGNOSIS — Z87891 Personal history of nicotine dependence: Secondary | ICD-10-CM | POA: Diagnosis not present

## 2020-06-17 DIAGNOSIS — R0602 Shortness of breath: Secondary | ICD-10-CM | POA: Diagnosis present

## 2020-06-17 DIAGNOSIS — J069 Acute upper respiratory infection, unspecified: Secondary | ICD-10-CM | POA: Diagnosis not present

## 2020-06-17 LAB — CBC WITH DIFFERENTIAL/PLATELET
Abs Immature Granulocytes: 0.02 10*3/uL (ref 0.00–0.07)
Basophils Absolute: 0 10*3/uL (ref 0.0–0.1)
Basophils Relative: 1 %
Eosinophils Absolute: 0.2 10*3/uL (ref 0.0–0.5)
Eosinophils Relative: 3 %
HCT: 35.2 % — ABNORMAL LOW (ref 36.0–46.0)
Hemoglobin: 12.3 g/dL (ref 12.0–15.0)
Immature Granulocytes: 0 %
Lymphocytes Relative: 30 %
Lymphs Abs: 1.8 10*3/uL (ref 0.7–4.0)
MCH: 32.9 pg (ref 26.0–34.0)
MCHC: 34.9 g/dL (ref 30.0–36.0)
MCV: 94.1 fL (ref 80.0–100.0)
Monocytes Absolute: 0.4 10*3/uL (ref 0.1–1.0)
Monocytes Relative: 7 %
Neutro Abs: 3.7 10*3/uL (ref 1.7–7.7)
Neutrophils Relative %: 59 %
Platelets: 312 10*3/uL (ref 150–400)
RBC: 3.74 MIL/uL — ABNORMAL LOW (ref 3.87–5.11)
RDW: 13.3 % (ref 11.5–15.5)
WBC: 6.2 10*3/uL (ref 4.0–10.5)
nRBC: 0 % (ref 0.0–0.2)

## 2020-06-17 LAB — COMPREHENSIVE METABOLIC PANEL
ALT: 23 U/L (ref 0–44)
AST: 34 U/L (ref 15–41)
Albumin: 3.7 g/dL (ref 3.5–5.0)
Alkaline Phosphatase: 82 U/L (ref 38–126)
Anion gap: 5 (ref 5–15)
BUN: 10 mg/dL (ref 6–20)
CO2: 24 mmol/L (ref 22–32)
Calcium: 8.7 mg/dL — ABNORMAL LOW (ref 8.9–10.3)
Chloride: 108 mmol/L (ref 98–111)
Creatinine, Ser: 0.8 mg/dL (ref 0.44–1.00)
GFR, Estimated: >60 mL/min (ref >60)
Glucose, Bld: 97 mg/dL (ref 70–99)
Potassium: 4.1 mmol/L (ref 3.5–5.1)
Sodium: 137 mmol/L (ref 135–145)
Total Bilirubin: 0.9 mg/dL (ref 0.3–1.2)
Total Protein: 6.9 g/dL (ref 6.5–8.1)

## 2020-06-17 LAB — URINALYSIS, MICROSCOPIC (REFLEX)

## 2020-06-17 LAB — URINALYSIS, ROUTINE W REFLEX MICROSCOPIC
Bilirubin Urine: NEGATIVE
Glucose, UA: NEGATIVE mg/dL
Hgb urine dipstick: NEGATIVE
Ketones, ur: NEGATIVE mg/dL
Nitrite: NEGATIVE
Protein, ur: NEGATIVE mg/dL
Specific Gravity, Urine: 1.01 (ref 1.005–1.030)
pH: 7 (ref 5.0–8.0)

## 2020-06-17 LAB — RESP PANEL BY RT-PCR (FLU A&B, COVID) ARPGX2
Influenza A by PCR: NEGATIVE
Influenza B by PCR: NEGATIVE
SARS Coronavirus 2 by RT PCR: NEGATIVE

## 2020-06-17 MED ORDER — ONDANSETRON 4 MG PO TBDP: 4.0000 mg | ORAL_TABLET | Freq: Three times a day (TID) | ORAL | 0 refills | Status: DC | PRN

## 2020-06-17 MED ORDER — DEXAMETHASONE SODIUM PHOSPHATE 10 MG/ML IJ SOLN
10.0000 mg | Freq: Once | INTRAMUSCULAR | Status: AC
  Administered 2020-06-17: 10 mg via INTRAMUSCULAR
  Filled 2020-06-17: qty 1

## 2020-06-17 MED ORDER — ONDANSETRON HCL 4 MG/2ML IJ SOLN
4.0000 mg | Freq: Once | INTRAMUSCULAR | Status: AC
  Administered 2020-06-17: 4 mg via INTRAVENOUS
  Filled 2020-06-17: qty 2

## 2020-06-17 MED ORDER — AEROCHAMBER PLUS FLO-VU MEDIUM MISC
1.0000 | Freq: Once | Status: AC
  Administered 2020-06-17: 1
  Filled 2020-06-17: qty 1

## 2020-06-17 MED ORDER — ALBUTEROL SULFATE HFA 108 (90 BASE) MCG/ACT IN AERS
2.0000 | INHALATION_SPRAY | Freq: Once | RESPIRATORY_TRACT | Status: AC
  Administered 2020-06-17: 2 via RESPIRATORY_TRACT
  Filled 2020-06-17: qty 6.7

## 2020-06-17 NOTE — ED Notes (Signed)
ED Provider at bedside. 

## 2020-06-17 NOTE — ED Triage Notes (Signed)
Pt states SOB and chest tightness since Sunday, states "feels like hairball in my chest".

## 2020-06-17 NOTE — ED Provider Notes (Signed)
[facility] EMERGENCY DEPARTMENT Provider Note   CSN: [id number] Arrival date & time: 06/17/20  1801     History Chief Complaint  Patient presents with  . Shortness of Breath    Susan Glover is a 55 y.o. female.  Pt presents to the ED today with sob.  Pt said she feels like she has a "hairball" in her chest.  Pt has been vaccinated against Covid, but several people at work have [place].  Last night, she used her son's inhaler before going to bed because she could not breathe.  She also feels nauseous when she tries to eat.  She denies any f/c.          Past Medical History:  Diagnosis Date  . Anxiety    hx of  . B12 deficiency 10/29/2019  . Chicken pox   . Depression    hx of  . Migraines     Patient Active Problem List   Diagnosis Date Noted  . B12 deficiency 10/29/2019    Past Surgical History:  Procedure Laterality Date  . APPENDECTOMY  1990  . BACK SURGERY    . FOOT FRACTURE SURGERY Bilateral 2019  . WISDOM TOOTH EXTRACTION       OB History   No obstetric history on file.     Family History  Problem Relation Age of Onset  . Asthma Mother   . Arthritis Mother   . Depression Mother   . Colon polyps Mother 81  . Alcohol abuse Father   . Heart disease Father        MI, brain aneurysm  . Hyperlipidemia Father   . COPD Father   . Arthritis Sister   . Depression Sister   . Colon polyps Sister 50  . Alcohol abuse Brother   . Asthma Son   . Depression Son   . Arthritis Maternal Grandmother   . Asthma Maternal Grandmother   . COPD Maternal Grandmother   . Alcohol abuse Maternal Grandfather   . Asthma Maternal Grandfather   . COPD Maternal Grandfather   . Drug abuse Maternal Grandfather   . Hyperlipidemia Maternal Grandfather   . Hypertension Maternal Grandfather   . Asthma Paternal Grandmother   . COPD Paternal Grandmother   . Heart attack Paternal Grandfather   . Hyperlipidemia Paternal Grandfather   . Hypertension Paternal  Grandfather   . Asthma Son   . Depression Son   . Colon cancer Neg Hx   . Esophageal cancer Neg Hx   . Rectal cancer Neg Hx   . Stomach cancer Neg Hx     Social History   Tobacco Use  . Smoking status: Former Smoker    Start date: 01/27/1985    Quit date: 01/28/2012    Years since quitting: 8.3  . Smokeless tobacco: Never Used  Vaping Use  . Vaping Use: Never used  Substance Use Topics  . Alcohol use: Yes    Alcohol/week: 7.0 standard drinks    Types: 7 Standard drinks or equivalent per week    Comment: 1 drink/day  . Drug use: Not Currently    Home Medications Prior to Admission medications   Medication Sig Start Date End Date Taking? Authorizing Provider  ondansetron (ZOFRAN ODT) 4 MG disintegrating tablet Take 1 tablet (4 mg total) by mouth every 8 (eight) hours as needed for nausea or vomiting. 06/17/20  Yes Isla Pence, MD  meloxicam (MOBIC) 15 MG tablet Take 15 mg by mouth daily. 10/07/19  [provider]  SUMAtriptan (IMITREX) 50 MG tablet Take 1 tab by mouth at start of migraine- may repeat in 2 hrs as needed. 10/28/19   [name], NP    Allergies    Poison ivy extract  Review of Systems   Review of Systems  Respiratory: Positive for shortness of breath.   All other systems reviewed and are negative.   Physical Exam Updated Vital Signs BP 132/78 (BP Location: Left Arm)   Pulse 84   Temp 98.1 F (36.7 C) (Oral)   Resp 16   Ht 5\' 7"  (1.702 m)   Wt 79.4 kg   SpO2 98%   BMI 27.41 kg/m   Physical Exam Vitals and nursing note reviewed.  Constitutional:      Appearance: She is well-developed.  HENT:     Head: Normocephalic and atraumatic.     Mouth/Throat:     Mouth: Mucous membranes are moist.     Pharynx: Oropharynx is clear.  Eyes:     Extraocular Movements: Extraocular movements intact.     Pupils: Pupils are equal, round, and reactive to light.  Cardiovascular:     Rate and Rhythm: Normal rate and regular rhythm.   Pulmonary:     Effort: Pulmonary effort is normal.     Breath sounds: Wheezing present.  Abdominal:     General: Bowel sounds are normal.     Palpations: Abdomen is soft.  Musculoskeletal:        General: Normal range of motion.     Cervical back: Normal range of motion and neck supple.  Skin:    General: Skin is warm.     Capillary Refill: Capillary refill takes less than 2 seconds.  Neurological:     General: No focal deficit present.     Mental Status: She is alert and oriented to person, place, and time.  Psychiatric:        Mood and Affect: Mood normal.        Behavior: Behavior normal.     ED Results / Procedures / Treatments   Labs (all labs ordered are listed, but only abnormal results are displayed) Labs Reviewed  COMPREHENSIVE METABOLIC PANEL - Abnormal; Notable for the following components:      Result Value   Calcium 8.7 (*)    All other components within normal limits  CBC WITH DIFFERENTIAL/PLATELET - Abnormal; Notable for the following components:   RBC 3.74 (*)    HCT 35.2 (*)    All other components within normal limits  URINALYSIS, ROUTINE W REFLEX MICROSCOPIC - Abnormal; Notable for the following components:   Leukocytes,Ua MODERATE (*)    All other components within normal limits  URINALYSIS, MICROSCOPIC (REFLEX) - Abnormal; Notable for the following components:   Bacteria, UA MANY (*)    All other components within normal limits  RESP PANEL BY RT-PCR (FLU A&B, COVID) ARPGX2    EKG None  Radiology DG Chest Portable 1 View  Result Date: 06/17/2020 CLINICAL DATA:  Shortness of breath and chest tightness since Sunday, feels like a hair bowel in my chest EXAM: PORTABLE CHEST 1 VIEW COMPARISON:  Portable exam 1850 hours without priors for comparison FINDINGS: Normal heart size, mediastinal contours, and pulmonary vascularity. Lungs clear. No pulmonary infiltrate, pleural effusion, or pneumothorax. Osseous structures unremarkable. IMPRESSION: No acute  abnormalities. Electronically Signed   By: Lavonia Dana M.D.   On: 06/17/2020 19:09    Procedures Procedures   Medications Ordered in ED Medications  dexamethasone (  DECADRON) injection 10 mg (10 mg Intramuscular Given 06/17/20 1857)  ondansetron (ZOFRAN) injection 4 mg (4 mg Intravenous Given 06/17/20 2211)  albuterol (VENTOLIN HFA) 108 (90 Base) MCG/ACT inhaler 2 puff (2 puffs Inhalation Given 06/17/20 2138)  AeroChamber Plus Flo-Vu Medium MISC 1 each (1 each Other Given 06/17/20 2138)    ED Course  I have reviewed the triage vital signs and the nursing notes.  Pertinent labs & imaging results that were available during my care of the patient were reviewed by me and considered in my medical decision making (see chart for details).    MDM Rules/Calculators/A&P                           Covid neg.  She is feeling a little nauseous, so she is given zofran.  She is able to tolerate po fluids.  Labs otherwise unremarkable.  Pt is stable for d/c.  Return if worse.  MAKEBA DELCASTILLO was evaluated in Emergency Department on 06/17/2020 for the symptoms described in the history of present illness. She was evaluated in the context of the global COVID-19 pandemic, which necessitated consideration that the patient might be at risk for infection with the SARS-CoV-2 virus that causes COVID-19. Institutional protocols and algorithms that pertain to the evaluation of patients at risk for COVID-19 are in a state of rapid change based on information released by regulatory bodies including the CDC and federal and state organizations. These policies and algorithms were followed during the patient's care in the ED. Final Clinical Impression(s) / ED Diagnoses Final diagnoses:  Viral upper respiratory tract infection  Nausea    Rx / DC Orders ED Discharge Orders         Ordered    ondansetron (ZOFRAN ODT) 4 MG disintegrating tablet  Every 8 hours PRN        06/17/20 2133           Isla Pence, MD 06/17/20  2309

## 2020-06-18 ENCOUNTER — Telehealth: Payer: Self-pay | Admitting: *Deleted

## 2020-06-18 NOTE — Telephone Encounter (Signed)
Patient went to ED

## 2020-06-18 NOTE — Telephone Encounter (Signed)
Who Is Calling Patient / Member / Family / Caregiver Call Type Triage / Clinical Relationship To Patient Self Return Phone Number [phone] (Primary) Chief Complaint BREATHING - shortness of breath or sounds breathless Reason for Call Symptomatic / Request for Health Information Initial Comment Caller states she is experiencing shortness of breath and feels like there is a cotton ball in her throat. She does get nauseated when she tries to eat. [place] Not Listed ER downstairs from the office. Translation No Nurse Assessment Nurse: Laqueta Due, RN, Metallurgist (Eastern Time): 06/17/2020 3:53:17 PM Confirm and document reason for call. If symptomatic, describe symptoms. ---pt having SOB and feels like cotton ball in throat. when eats gets nausea. pt coughed while nurse speaking introduction

## 2020-06-25 ENCOUNTER — Other Ambulatory Visit: Payer: Self-pay

## 2020-06-25 ENCOUNTER — Ambulatory Visit (INDEPENDENT_AMBULATORY_CARE_PROVIDER_SITE_OTHER): Payer: Managed Care, Other (non HMO)

## 2020-06-25 DIAGNOSIS — E538 Deficiency of other specified B group vitamins: Secondary | ICD-10-CM | POA: Diagnosis not present

## 2020-06-25 MED ORDER — CYANOCOBALAMIN 1000 MCG/ML IJ SOLN
1000.0000 ug | Freq: Once | INTRAMUSCULAR | Status: AC
  Administered 2020-06-25: 1000 ug via INTRAMUSCULAR

## 2020-06-25 NOTE — Progress Notes (Signed)
Susan Glover is a 55 y.o. female presents to the office today for Cyanocobalamin injections, per physician's orders. Original order: per [name] FNP.  Cyanocobalamin (med), 1000 mg/m. (dose),  IM (route) was administered left arm (location) today. Patient tolerated injection.  Patient next injection due: in one month, appt made for 07-24-2020.   Jiles Prows

## 2020-07-14 ENCOUNTER — Encounter: Payer: Self-pay | Admitting: Family

## 2020-07-14 DIAGNOSIS — N912 Amenorrhea, unspecified: Secondary | ICD-10-CM

## 2020-07-14 DIAGNOSIS — E538 Deficiency of other specified B group vitamins: Secondary | ICD-10-CM

## 2020-07-24 ENCOUNTER — Other Ambulatory Visit: Payer: Self-pay

## 2020-07-24 ENCOUNTER — Ambulatory Visit (INDEPENDENT_AMBULATORY_CARE_PROVIDER_SITE_OTHER): Payer: Managed Care, Other (non HMO) | Admitting: Family

## 2020-07-24 VITALS — BP 123/79 | HR 68 | Temp 98.7°F | Resp 16 | Wt 181.0 lb

## 2020-07-24 DIAGNOSIS — R41840 Attention and concentration deficit: Secondary | ICD-10-CM | POA: Diagnosis not present

## 2020-07-24 DIAGNOSIS — E538 Deficiency of other specified B group vitamins: Secondary | ICD-10-CM | POA: Diagnosis not present

## 2020-07-24 DIAGNOSIS — K219 Gastro-esophageal reflux disease without esophagitis: Secondary | ICD-10-CM

## 2020-07-24 MED ORDER — CYANOCOBALAMIN 1000 MCG/ML IJ SOLN
1000.0000 ug | Freq: Once | INTRAMUSCULAR | Status: AC
  Administered 2020-07-24: 1000 ug via INTRAMUSCULAR

## 2020-07-24 NOTE — Patient Instructions (Addendum)
Please stop meloxicam. Start pantoprazole 40mg  once daily for reflux.   For ADHD evaluation you can try scheduling an evaluation at the following locations:  [facility] [phone] [facility] [phone]  You can also try [facility] in [phone]

## 2020-07-24 NOTE — Progress Notes (Signed)
Subjective:   By signing my name below, I, Shehryar Baig, attest that this documentation has been prepared under the direction and in the presence of [name]. 07/24/2020    Patient ID: Susan Glover, female    DOB: Jan 26, 1965, 55 y.o.   MRN: [id number]  Chief Complaint  Patient presents with   Gastroesophageal Reflux    Complains of acid reflux    Nausea    Complains of nausea after eating   Bloated    Feeling bloated     HPI Patient is in today for a office visit.  GERD- She went to the ER last month because of SOB, feeling something is stuck in throat, constant feeling of wanting to vomit, and abdominal pain. She was given anti-nausea medication to manage her symptoms but found no relief. She was also given an inhaler to use daily but finds mild relief. She does not have abdominal pain at this time. After her visit she started using OTC prevacid to manage her symptoms and found great relief. She continues using 15 mg meloxicam daily PO at this time. ADD- During her last job she would sit down and be overwhelmed and could not concentrate on one thing consistently. She complains at her new job she loses focus easily on some days. She was not treated for ADD as a child. She prefers to get tested near her home in [place].  Health Maintenance Due  Topic Date Due   HIV Screening  Never done   Hepatitis C Screening  Never done   MAMMOGRAM  Never done   Zoster Vaccines- Shingrix (1 of 2) Never done   COVID-19 Vaccine (4 - Booster for [employer] series) 07/17/2020    Past Medical History:  Diagnosis Date   Anxiety    hx of   B12 deficiency 10/29/2019   Chicken pox    Depression    hx of   Migraines     Past Surgical History:  Procedure Laterality Date   APPENDECTOMY  1990   BACK SURGERY     FOOT FRACTURE SURGERY Bilateral 2019   WISDOM TOOTH EXTRACTION      Family History  Problem Relation Age of Onset   Asthma Mother    Arthritis Mother     Depression Mother    Colon polyps Mother 32   Alcohol abuse Father    Heart disease Father        MI, brain aneurysm   Hyperlipidemia Father    COPD Father    Arthritis Sister    Depression Sister    Colon polyps Sister 73   Alcohol abuse Brother    Asthma Son    Depression Son    Arthritis Maternal Grandmother    Asthma Maternal Grandmother    COPD Maternal Grandmother    Alcohol abuse Maternal Grandfather    Asthma Maternal Grandfather    COPD Maternal Grandfather    Drug abuse Maternal Grandfather    Hyperlipidemia Maternal Grandfather    Hypertension Maternal Grandfather    Asthma Paternal Grandmother    COPD Paternal Grandmother    Heart attack Paternal Grandfather    Hyperlipidemia Paternal Grandfather    Hypertension Paternal Grandfather    Asthma Son    Depression Son    Colon cancer Neg Hx    Esophageal cancer Neg Hx    Rectal cancer Neg Hx    Stomach cancer Neg Hx     Social History   Socioeconomic History   Marital  status: Divorced    Spouse name: Not on file   Number of children: Not on file   Years of education: Not on file   Highest education level: Not on file  Occupational History   Not on file  Tobacco Use   Smoking status: Former    Pack years: 0.00    Types: Cigarettes    Start date: 01/27/1985    Quit date: 01/28/2012    Years since quitting: 8.4   Smokeless tobacco: Never  Vaping Use   Vaping Use: Never used  Substance and Sexual Activity   Alcohol use: Yes    Alcohol/week: 7.0 standard drinks    Types: 7 Standard drinks or equivalent per week    Comment: 1 drink/day   Drug use: Not Currently   Sexual activity: Yes    Partners: Male  Other Topics Concern   Not on file  Social History Narrative   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)   Has been married and divorced twice, lives with [name]   2 sons   2004- Will   2007- [name]   Completed some college   Enjoys gardening, games, reading   2 cats and 2 dogs, 2 crabs   Social  Determinants of Radio broadcast assistant Strain: Not on file  Food Insecurity: Not on file  Transportation Needs: Not on file  Physical Activity: Not on file  Stress: Not on file  Social Connections: Not on file  Intimate Partner Violence: Not on file    Outpatient Medications Prior to Visit  Medication Sig Dispense Refill   SUMAtriptan (IMITREX) 50 MG tablet Take 1 tab by mouth at start of migraine- may repeat in 2 hrs as needed. 10 tablet 5   meloxicam (MOBIC) 15 MG tablet Take 15 mg by mouth daily.     ondansetron (ZOFRAN ODT) 4 MG disintegrating tablet Take 1 tablet (4 mg total) by mouth every 8 (eight) hours as needed for nausea or vomiting. 10 tablet 0   No facility-administered medications prior to visit.    Allergies  Allergen Reactions   Poison Ivy Extract Dermatitis, Itching and Rash    ROS     Objective:    Physical Exam Constitutional:      General: She is not in acute distress.    Appearance: Normal appearance. She is not ill-appearing.  HENT:     Head: Normocephalic and atraumatic.     Right Ear: External ear normal.     Left Ear: External ear normal.  Eyes:     Extraocular Movements: Extraocular movements intact.     Pupils: Pupils are equal, round, and reactive to light.  Cardiovascular:     Rate and Rhythm: Normal rate and regular rhythm.     Pulses: Normal pulses.     Heart sounds: Normal heart sounds. No murmur heard.   No gallop.  Pulmonary:     Effort: Pulmonary effort is normal. No respiratory distress.     Breath sounds: Normal breath sounds. No wheezing, rhonchi or rales.  Abdominal:     General: Bowel sounds are normal.     Palpations: Abdomen is soft.     Tenderness: There is no abdominal tenderness.  Skin:    General: Skin is warm and dry.  Neurological:     Mental Status: She is alert and oriented to person, place, and time.  Psychiatric:        Behavior: Behavior normal.    BP 123/79 (BP Location: Right Arm, Patient  Position: Sitting, Cuff Size: Small)   Pulse 68   Temp 98.7 F (37.1 C) (Oral)   Resp 16   Wt 181 lb (82.1 kg)   SpO2 99%   BMI 28.35 kg/m  Wt Readings from Last 3 Encounters:  07/24/20 181 lb (82.1 kg)  06/17/20 175 lb (79.4 kg)  01/28/20 178 lb (80.7 kg)       Assessment & Plan:   Problem List Items Addressed This Visit       Unprioritized   Gastroesophageal reflux disease - Primary    Uncontrolled. Advised pt to d/c meloxicam, add protonis 40mg  once daily. Discussed gerd diet.         B12 deficiency    Continue monthly b12 injections.       Attention deficit    Recommended formal ADHD evaluation. She prefers to do this in [place]. I gave her some numbers to call to see if they accept her insurance.          Meds ordered this encounter  Medications   cyanocobalamin ((VITAMIN B-12)) injection 1,000 mcg    I, [name], personally preformed the services described in this documentation.  All medical record entries made by the scribe were at my direction and in my presence.  I have reviewed the chart and discharge instructions (if applicable) and agree that the record reflects my personal performance and is accurate and complete. 07/24/2020   I,Shehryar Baig,acting as a scribe for Nance Pear, NP.,have documented all relevant documentation on the behalf of Nance Pear, NP,as directed by  Nance Pear, NP while in the presence of Nance Pear, NP.   Nance Pear, NP

## 2020-07-25 DIAGNOSIS — K219 Gastro-esophageal reflux disease without esophagitis: Secondary | ICD-10-CM | POA: Insufficient documentation

## 2020-07-25 DIAGNOSIS — R41840 Attention and concentration deficit: Secondary | ICD-10-CM | POA: Insufficient documentation

## 2020-07-25 NOTE — Assessment & Plan Note (Signed)
Uncontrolled. Advised pt to d/c meloxicam, add protonis 40mg  once daily. Discussed gerd diet.

## 2020-07-25 NOTE — Assessment & Plan Note (Signed)
Recommended formal ADHD evaluation. She prefers to do this in [place]. I gave her some numbers to call to see if they accept her insurance.

## 2020-07-25 NOTE — Assessment & Plan Note (Signed)
Continue monthly b12 injections.  

## 2020-07-29 ENCOUNTER — Encounter: Payer: Self-pay | Admitting: Family

## 2020-07-30 MED ORDER — PANTOPRAZOLE SODIUM 40 MG PO TBEC: 40.0000 mg | DELAYED_RELEASE_TABLET | Freq: Every day | ORAL | 3 refills | Status: DC

## 2020-08-06 ENCOUNTER — Encounter: Payer: Self-pay | Admitting: Family

## 2020-08-06 DIAGNOSIS — B029 Zoster without complications: Secondary | ICD-10-CM

## 2020-08-06 HISTORY — DX: Zoster without complications: B02.9

## 2020-08-21 ENCOUNTER — Ambulatory Visit (INDEPENDENT_AMBULATORY_CARE_PROVIDER_SITE_OTHER): Payer: Managed Care, Other (non HMO) | Admitting: Family

## 2020-08-21 ENCOUNTER — Other Ambulatory Visit: Payer: Self-pay

## 2020-08-21 ENCOUNTER — Telehealth: Payer: Self-pay | Admitting: Family

## 2020-08-21 VITALS — BP 116/87 | HR 65 | Temp 98.2°F | Resp 16 | Ht 67.5 in | Wt 177.0 lb

## 2020-08-21 DIAGNOSIS — R232 Flushing: Secondary | ICD-10-CM

## 2020-08-21 DIAGNOSIS — B029 Zoster without complications: Secondary | ICD-10-CM | POA: Insufficient documentation

## 2020-08-21 DIAGNOSIS — E538 Deficiency of other specified B group vitamins: Secondary | ICD-10-CM

## 2020-08-21 DIAGNOSIS — R41840 Attention and concentration deficit: Secondary | ICD-10-CM

## 2020-08-21 DIAGNOSIS — K219 Gastro-esophageal reflux disease without esophagitis: Secondary | ICD-10-CM

## 2020-08-21 HISTORY — DX: Zoster without complications: B02.9

## 2020-08-21 LAB — FOLLICLE STIMULATING HORMONE: FSH: 70.2 m[IU]/mL

## 2020-08-21 LAB — VITAMIN B12: Vitamin B-12: >1550 pg/mL — ABNORMAL HIGH (ref 211–911)

## 2020-08-21 LAB — LUTEINIZING HORMONE: LH: 79.08 m[IU]/mL

## 2020-08-21 MED ORDER — CYANOCOBALAMIN 1000 MCG/ML IJ SOLN
1000.0000 ug | Freq: Once | INTRAMUSCULAR | Status: AC
  Administered 2020-08-21: 1000 ug via INTRAMUSCULAR

## 2020-08-21 NOTE — Assessment & Plan Note (Signed)
She plans to look into testing in [place] at a later date.

## 2020-08-21 NOTE — Assessment & Plan Note (Signed)
Globus sensation has resolved. Nausea improving. Discussed abdominal US to look at [facility] if worsening nausea or if nausea does not continue to improve.

## 2020-08-21 NOTE — Patient Instructions (Signed)
Please complete lab work prior to leaving. Continue protonix '40mg'$  once daily.

## 2020-08-21 NOTE — Assessment & Plan Note (Signed)
Improving.  We discussed Shingrix next visit when she is fully recovered.

## 2020-08-21 NOTE — Progress Notes (Addendum)
Subjective:   By signing my name below, I, Shehryar Baig, attest that this documentation has been prepared under the direction and in the presence of [name]. 08/21/2020    Patient ID: Susan Glover, female    DOB: 1965/04/02, 55 y.o.   MRN: [id number]  Chief Complaint  Patient presents with   B12 deficiency   Gastroesophageal Reflux    Here for follow up    HPI Patient is in today for a office visit.  Reflux- Since last visit she started taking 40 mg Protonix daily PO and reports doing well while taking it. She does not have a feeling of something stuck in her throat anymore but still feels occasional nausea.  Shingles- She has recently recovered from shingles. Her symptoms have improved but her rash persists on her lower abdomen and it is mildly tender. She is interested in taking the shingles vaccine until her symptoms have improved further. Menopause- She is requesting menopause testing. She is experiencing hot flashes and her last menstrual cycle was 1.5 years ago. She notes that she had bleeding after her last visit but no other episodes after that.  B12- She continues taking vitamin B12 injections.  ADHD- She has not successfully set up an appointment with a specialist in [place].    Health Maintenance Due  Topic Date Due   HIV Screening  Never done   Hepatitis C Screening  Never done   MAMMOGRAM  Never done   Zoster Vaccines- Shingrix (1 of 2) Never done   COVID-19 Vaccine (4 - Booster for [employer] series) 07/17/2020   INFLUENZA VACCINE  08/17/2020    Past Medical History:  Diagnosis Date   Anxiety    hx of   B12 deficiency 10/29/2019   Chicken pox    Depression    hx of   Migraines    Shingles 08/06/2020    Past Surgical History:  Procedure Laterality Date   APPENDECTOMY  1990   BACK SURGERY     FOOT FRACTURE SURGERY Bilateral 2019   WISDOM TOOTH EXTRACTION      Family History  Problem Relation Age of Onset   Asthma Mother     Arthritis Mother    Depression Mother    Colon polyps Mother 73   Alcohol abuse Father    Heart disease Father        MI, brain aneurysm   Hyperlipidemia Father    COPD Father    Arthritis Sister    Depression Sister    Colon polyps Sister 73   Alcohol abuse Brother    Asthma Son    Depression Son    Arthritis Maternal Grandmother    Asthma Maternal Grandmother    COPD Maternal Grandmother    Alcohol abuse Maternal Grandfather    Asthma Maternal Grandfather    COPD Maternal Grandfather    Drug abuse Maternal Grandfather    Hyperlipidemia Maternal Grandfather    Hypertension Maternal Grandfather    Asthma Paternal Grandmother    COPD Paternal Grandmother    Heart attack Paternal Grandfather    Hyperlipidemia Paternal Grandfather    Hypertension Paternal Grandfather    Asthma Son    Depression Son    Colon cancer Neg Hx    Esophageal cancer Neg Hx    Rectal cancer Neg Hx    Stomach cancer Neg Hx     Social History   Socioeconomic History   Marital status: Divorced    Spouse name: Not on file  Number of children: Not on file   Years of education: Not on file   Highest education level: Not on file  Occupational History   Not on file  Tobacco Use   Smoking status: Former    Types: Cigarettes    Start date: 01/27/1985    Quit date: 01/28/2012    Years since quitting: 8.5   Smokeless tobacco: Never  Vaping Use   Vaping Use: Never used  Substance and Sexual Activity   Alcohol use: Yes    Alcohol/week: 7.0 standard drinks    Types: 7 Standard drinks or equivalent per week    Comment: 1 drink/day   Drug use: Not Currently   Sexual activity: Yes    Partners: Male  Other Topics Concern   Not on file  Social History Narrative   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)   Has been married and divorced twice, lives with [name]   2 sons   2004- Will   2007- [name]   Completed some college   Enjoys gardening, games, reading   2 cats and 2 dogs, 2 crabs   Social  Determinants of Radio broadcast assistant Strain: Not on file  Food Insecurity: Not on file  Transportation Needs: Not on file  Physical Activity: Not on file  Stress: Not on file  Social Connections: Not on file  Intimate Partner Violence: Not on file    Outpatient Medications Prior to Visit  Medication Sig Dispense Refill   pantoprazole (PROTONIX) 40 MG tablet Take 1 tablet (40 mg total) by mouth daily. 30 tablet 3   SUMAtriptan (IMITREX) 50 MG tablet Take 1 tab by mouth at start of migraine- may repeat in 2 hrs as needed. 10 tablet 5   No facility-administered medications prior to visit.    Allergies  Allergen Reactions   Poison Ivy Extract Dermatitis, Itching and Rash    ROS    See HPI  Objective:    Physical Exam Constitutional:      General: She is not in acute distress.    Appearance: Normal appearance. She is not ill-appearing.  HENT:     Head: Normocephalic and atraumatic.     Right Ear: Tympanic membrane, ear canal and external ear normal.     Left Ear: Tympanic membrane, ear canal and external ear normal.  Eyes:     Extraocular Movements: Extraocular movements intact.     Pupils: Pupils are equal, round, and reactive to light.  Cardiovascular:     Rate and Rhythm: Normal rate and regular rhythm.     Heart sounds: Normal heart sounds. No murmur heard.   No gallop.  Pulmonary:     Effort: Pulmonary effort is normal. No respiratory distress.     Breath sounds: Normal breath sounds. No wheezing or rales.  Skin:    General: Skin is warm and dry.     Comments: Rash consistent with healing herpes zoster noted in right groin  Neurological:     General: No focal deficit present.     Mental Status: She is alert and oriented to person, place, and time.     Motor: No weakness.  Psychiatric:        Behavior: Behavior normal.        Judgment: Judgment normal.    BP 116/87 (BP Location: Right Arm, Patient Position: Sitting, Cuff Size: Small)   Pulse 65    Temp 98.2 F (36.8 C) (Oral)   Resp 16   Ht 5' 7.5" (1.715  m)   Wt 177 lb (80.3 kg)   SpO2 100%   BMI 27.31 kg/m  Wt Readings from Last 3 Encounters:  08/21/20 177 lb (80.3 kg)  07/24/20 181 lb (82.1 kg)  06/17/20 175 lb (79.4 kg)       Assessment & Plan:   Problem List Items Addressed This Visit       Unprioritized   Hot flashes    Check FSH, LH, Estradiol.  Will likely refer to GYN if lab work shows menopausal state as she had a period after 18 months amenorrhea.        Relevant Orders   Estradiol   FSH   LH   Herpes zoster without complication    Improving.  We discussed Shingrix next visit when she is fully recovered.        Gastroesophageal reflux disease    Globus sensation has resolved. Nausea improving. Discussed abdominal US to look at [facility] if worsening nausea or if nausea does not continue to improve.        B12 deficiency - Primary    b12 injection today.  Continue monthly, obtain follow up b12 level.        Relevant Medications   cyanocobalamin ((VITAMIN B-12)) injection 1,000 mcg   Other Relevant Orders   B12   Attention deficit    She plans to look into testing in [place] at a later date.          Meds ordered this encounter  Medications   cyanocobalamin ((VITAMIN B-12)) injection 1,000 mcg    I, [name], personally preformed the services described in this documentation.  All medical record entries made by the scribe were at my direction and in my presence.  I have reviewed the chart and discharge instructions (if applicable) and agree that the record reflects my personal performance and is accurate and complete. 08/21/2020   I,Shehryar Baig,acting as a Education administrator for Nance Pear, NP.,have documented all relevant documentation on the behalf of Nance Pear, NP,as directed by  Nance Pear, NP while in the presence of Nance Pear, NP.   Nance Pear, NP

## 2020-08-21 NOTE — Telephone Encounter (Signed)
Opened in error

## 2020-08-21 NOTE — Assessment & Plan Note (Signed)
Check FSH, LH, Estradiol.  Will likely refer to GYN if lab work shows menopausal state as she had a period after 18 months amenorrhea.

## 2020-08-21 NOTE — Assessment & Plan Note (Signed)
b12 injection today.  Continue monthly, obtain follow up b12 level.

## 2020-08-22 LAB — ESTRADIOL: Estradiol: <15 pg/mL

## 2020-08-24 ENCOUNTER — Telehealth: Payer: Self-pay | Admitting: Family

## 2020-08-24 DIAGNOSIS — Z780 Asymptomatic menopausal state: Secondary | ICD-10-CM

## 2020-08-24 NOTE — Telephone Encounter (Signed)
Please advise pt that her lab work shows that she is menopausal.  I would like to refer her to GYN though since she recently had another period after 18 months.  Referral pended.

## 2020-08-25 NOTE — Telephone Encounter (Signed)
Patient advised of results, provider's comments and referral. She verbalized understanding. Referral was signed

## 2020-09-14 ENCOUNTER — Encounter: Payer: Self-pay | Admitting: Family

## 2020-09-14 DIAGNOSIS — R41840 Attention and concentration deficit: Secondary | ICD-10-CM

## 2020-10-02 ENCOUNTER — Encounter: Payer: Self-pay | Admitting: Family

## 2020-10-05 NOTE — Telephone Encounter (Signed)
Patient called back wanted to know if she needs to see provider instead of a nurse visit due to new symptom of tingling and numbness of both hands.

## 2020-10-05 NOTE — Telephone Encounter (Signed)
Lvm for patient to call, she will need  to cancel the [place] appointment she has on 10-09-20 and schedule an office visit to see Melissa for hands numbness and tingling symptoms and b12 shot with labs prior to shot.

## 2020-10-05 NOTE — Telephone Encounter (Signed)
Called patient to set up appointment for b12 shot, no answer, left voice mail.  Please advised if B12 lab needed before injection on same day.

## 2020-10-06 NOTE — Telephone Encounter (Signed)
Patient is scheduled for 10-12-20

## 2020-10-09 ENCOUNTER — Ambulatory Visit: Payer: Managed Care, Other (non HMO)

## 2020-10-12 ENCOUNTER — Ambulatory Visit: Payer: Managed Care, Other (non HMO) | Admitting: Family

## 2020-10-14 ENCOUNTER — Encounter: Payer: Self-pay | Admitting: Family Medicine

## 2020-10-14 ENCOUNTER — Ambulatory Visit (INDEPENDENT_AMBULATORY_CARE_PROVIDER_SITE_OTHER): Payer: Managed Care, Other (non HMO) | Admitting: Family Medicine

## 2020-10-14 ENCOUNTER — Other Ambulatory Visit (HOSPITAL_COMMUNITY)
Admission: RE | Admit: 2020-10-14 | Discharge: 2020-10-14 | Disposition: A | Discharge status: Home / Self Care | Payer: Managed Care, Other (non HMO) | Source: Ambulatory Visit | Attending: Family Medicine | Admitting: Family Medicine

## 2020-10-14 ENCOUNTER — Other Ambulatory Visit: Payer: Self-pay

## 2020-10-14 VITALS — BP 117/66 | HR 72 | Wt 181.0 lb

## 2020-10-14 DIAGNOSIS — Z01419 Encounter for gynecological examination (general) (routine) without abnormal findings: Secondary | ICD-10-CM

## 2020-10-14 DIAGNOSIS — N951 Menopausal and female climacteric states: Secondary | ICD-10-CM

## 2020-10-14 MED ORDER — ESTRADIOL-NORETHINDRONE ACET 1-0.5 MG PO TABS: 1.0000 | ORAL_TABLET | Freq: Every day | ORAL | 1 refills | Status: DC

## 2020-10-14 NOTE — Progress Notes (Signed)
GYNECOLOGY ANNUAL PREVENTATIVE CARE ENCOUNTER NOTE  Subjective:   Susan Glover is a 55 y.o. G58P2002 female here for a routine annual gynecologic exam.  Current complaints: increased anxiety, depression, night sweats, sleep disturbances, some hot flashes. Had lab testing, showing that she was in menopause. Did have about 1 year and 3 months without a period, then had a period about 2 months ago shortly after have viral illness that she assumed was COVID. No further bleeding.   Denies abnormal vaginal bleeding, discharge, pelvic pain, problems with intercourse or other gynecologic concerns.    Gynecologic History Patient's last menstrual period was 07/24/2020. Patient is sexually active  Contraception: post menopausal status Last Pap: 2021. Results were: normal Last mammogram: uncertain. Results were: normal  Obstetric History OB History  Gravida Para Term Preterm AB Living  2 2 2     2   SAB IAB Ectopic Multiple Live Births          2    # Outcome Date GA Lbr Len/2nd Weight Sex Delivery Anes PTL Lv  2 Term 2007 [redacted]w[redacted]d   M Vag-Spont EPI N LIV  1 Term 2004 [redacted]w[redacted]d   M Vag-Spont EPI N LIV    Past Medical History:  Diagnosis Date   Anxiety    hx of   B12 deficiency 10/29/2019   Chicken pox    Depression    hx of   Migraines    Shingles 08/06/2020    Past Surgical History:  Procedure Laterality Date   APPENDECTOMY  1990   BACK SURGERY     FOOT FRACTURE SURGERY Bilateral 2019   WISDOM TOOTH EXTRACTION      Current Outpatient Medications on File Prior to Visit  Medication Sig Dispense Refill   SUMAtriptan (IMITREX) 50 MG tablet Take 1 tab by mouth at start of migraine- may repeat in 2 hrs as needed. 10 tablet 5   pantoprazole (PROTONIX) 40 MG tablet Take 1 tablet (40 mg total) by mouth daily. (Patient not taking: Reported on 10/14/2020) 30 tablet 3   No current facility-administered medications on file prior to visit.    Allergies  Allergen Reactions   Poison Ivy  Extract Dermatitis, Itching and Rash    Social History   Socioeconomic History   Marital status: Divorced    Spouse name: Not on file   Number of children: Not on file   Years of education: Not on file   Highest education level: Not on file  Occupational History   Not on file  Tobacco Use   Smoking status: Former    Types: Cigarettes    Start date: 01/27/1985    Quit date: 01/28/2012    Years since quitting: 8.7   Smokeless tobacco: Never  Vaping Use   Vaping Use: Never used  Substance and Sexual Activity   Alcohol use: Yes    Alcohol/week: 2.0 standard drinks    Types: 2 Standard drinks or equivalent per week    Comment: 1 drink/day   Drug use: Never   Sexual activity: Yes    Partners: Male  Other Topics Concern   Not on file  Social History Narrative   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)   Has been married and divorced twice, lives with [name]   2 sons   2004- Will   2007- [name]   Completed some college   Enjoys gardening, games, reading   2 cats and 2 dogs, 2 crabs   Social Determinants of Health  Financial Resource Strain: Not on file  Food Insecurity: Not on file  Transportation Needs: Not on file  Physical Activity: Not on file  Stress: Not on file  Social Connections: Not on file  Intimate Partner Violence: Not on file    Family History  Problem Relation Age of Onset   Asthma Mother    Arthritis Mother    Depression Mother    Colon polyps Mother 30   Alcohol abuse Father    Heart disease Father        MI, brain aneurysm   Hyperlipidemia Father    COPD Father    Arthritis Sister    Depression Sister    Colon polyps Sister 34   Alcohol abuse Brother    Asthma Son    Depression Son    Arthritis Maternal Grandmother    Asthma Maternal Grandmother    COPD Maternal Grandmother    Alcohol abuse Maternal Grandfather    Asthma Maternal Grandfather    COPD Maternal Grandfather    Drug abuse Maternal Grandfather    Hyperlipidemia Maternal  Grandfather    Hypertension Maternal Grandfather    Asthma Paternal Grandmother    COPD Paternal Grandmother    Heart attack Paternal Grandfather    Hyperlipidemia Paternal Grandfather    Hypertension Paternal Grandfather    Asthma Son    Depression Son    Colon cancer Neg Hx    Esophageal cancer Neg Hx    Rectal cancer Neg Hx    Stomach cancer Neg Hx     The following portions of the patient's history were reviewed and updated as appropriate: allergies, current medications, past family history, past medical history, past social history, past surgical history and problem list.  Review of Systems Pertinent items are noted in HPI.   Objective:  BP 117/66   Pulse 72   Wt 181 lb (82.1 kg)   LMP 07/24/2020   BMI 27.93 kg/m  Wt Readings from Last 3 Encounters:  10/14/20 181 lb (82.1 kg)  08/21/20 177 lb (80.3 kg)  07/24/20 181 lb (82.1 kg)     Chaperone present during exam  CONSTITUTIONAL: Well-developed, well-nourished female in no acute distress.  HENT:  Normocephalic, atraumatic, External right and left ear normal. Oropharynx is clear and moist EYES: Conjunctivae and EOM are normal. Pupils are equal, round, and reactive to light. No scleral icterus.  NECK: Normal range of motion, supple, no masses.  Normal thyroid.   CARDIOVASCULAR: Normal heart rate noted, regular rhythm RESPIRATORY: Clear to auscultation bilaterally. Effort and breath sounds normal, no problems with respiration noted. BREASTS: Symmetric in size. No masses, skin changes, nipple drainage, or lymphadenopathy. ABDOMEN: Soft, normal bowel sounds, no distention noted.  No tenderness, rebound or guarding.  PELVIC: Normal appearing external genitalia; normal appearing vaginal mucosa and cervix.  No abnormal discharge noted.   MUSCULOSKELETAL: Normal range of motion. No tenderness.  No cyanosis, clubbing, or edema.  2+ distal pulses. SKIN: Skin is warm and dry. No rash noted. Not diaphoretic. No erythema. No  pallor. NEUROLOGIC: Alert and oriented to person, place, and time. Normal reflexes, muscle tone coordination. No cranial nerve deficit noted. PSYCHIATRIC: Normal mood and affect. Normal behavior. Normal judgment and thought content.  Assessment:  Annual gynecologic examination with pap smear   Plan:  1. Well Woman Exam Will follow up results of pap smear and manage accordingly. Mammogram scheduled STD testing discussed. Patient declined testing - MM DIGITAL SCREENING BILATERAL; Future - Cytology - PAP( [place])  2. Vasomotor symptoms due to menopause Discussed HRT.  5 yr Breast cancer risk: 1.4% 1yr Heart Score 1.05% Patient would like to start treatment with Activella 1mg /0.5mg . Return in 8-12 weeks for recheck. 2 week trial given - if tolerating, then will increase to 90 days. Discussed potential side effects. Patient to call with any problems.    Routine preventative health maintenance measures emphasized. Please refer to After Visit Summary for other counseling recommendations.    10-12-1999, [place] for [address]

## 2020-10-15 ENCOUNTER — Other Ambulatory Visit: Payer: Self-pay | Admitting: Family Medicine

## 2020-10-15 ENCOUNTER — Telehealth (HOSPITAL_BASED_OUTPATIENT_CLINIC_OR_DEPARTMENT_OTHER): Payer: Self-pay | Admitting: Family Medicine

## 2020-10-16 LAB — CYTOLOGY - PAP
Comment: NEGATIVE
Diagnosis: NEGATIVE
High risk HPV: NEGATIVE

## 2020-10-19 ENCOUNTER — Other Ambulatory Visit: Payer: Self-pay

## 2020-10-19 DIAGNOSIS — N951 Menopausal and female climacteric states: Secondary | ICD-10-CM

## 2020-10-19 MED ORDER — ESTRADIOL-NORETHINDRONE ACET 1-0.5 MG PO TABS: 1.0000 | ORAL_TABLET | Freq: Every day | ORAL | 1 refills | Status: DC

## 2020-10-19 NOTE — Progress Notes (Signed)
Per pharmacist, the pt's prescription for Estradiol tablets comes in 28 tablets per pk but the order was for 14 tablets. A new Rx for Estradiol 28 tablets was sent to her pharmacy. Torie Towle l Lilyanne Mcquown, CMA

## 2020-10-20 ENCOUNTER — Encounter: Payer: Self-pay | Admitting: Family

## 2020-10-20 ENCOUNTER — Ambulatory Visit (INDEPENDENT_AMBULATORY_CARE_PROVIDER_SITE_OTHER): Payer: Managed Care, Other (non HMO) | Admitting: Family

## 2020-10-20 ENCOUNTER — Other Ambulatory Visit: Payer: Self-pay

## 2020-10-20 VITALS — BP 117/63 | HR 88 | Temp 98.6°F | Resp 12 | Ht 67.5 in | Wt 177.4 lb

## 2020-10-20 DIAGNOSIS — Z23 Encounter for immunization: Secondary | ICD-10-CM

## 2020-10-20 DIAGNOSIS — E538 Deficiency of other specified B group vitamins: Secondary | ICD-10-CM | POA: Diagnosis not present

## 2020-10-20 DIAGNOSIS — R200 Anesthesia of skin: Secondary | ICD-10-CM

## 2020-10-20 DIAGNOSIS — G5603 Carpal tunnel syndrome, bilateral upper limbs: Secondary | ICD-10-CM

## 2020-10-20 MED ORDER — MELOXICAM 7.5 MG PO TABS: 7.5000 mg | ORAL_TABLET | Freq: Every day | ORAL | 0 refills | Status: DC

## 2020-10-20 MED ORDER — CYANOCOBALAMIN 1000 MCG/ML IJ SOLN
1000.0000 ug | Freq: Once | INTRAMUSCULAR | Status: AC
Start: 2020-10-20 — End: 2020-10-20
  Administered 2020-10-20: 1000 ug via INTRAMUSCULAR

## 2020-10-20 NOTE — Assessment & Plan Note (Signed)
Numbness in feet seems to correlate with L5 dermatome. She does have a history of lumbar radiculopathy.  Will see how she does with meloxicam. Further evaluation per neurology.

## 2020-10-20 NOTE — Assessment & Plan Note (Signed)
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 Recommended the following:  Please wear wrist splints while sleeping and as able throughout the day. Begin Meloxicam 7.5mg  once daily (anti-inflammatory) You should be contacted about your referral to Neurology.

## 2020-10-20 NOTE — Progress Notes (Signed)
Subjective:   By signing my name below, I, Lyric Barr-McArthur, attest that this documentation has been prepared under the direction and in the presence of [name], NP, 10/20/2020   Patient ID: Susan Glover, female    DOB: March 24, 1965, 55 y.o.   MRN: [id number]  Chief Complaint  Patient presents with   Numbness    Also pain in fingers, hands, and toes     HPI Patient is in today for an office visit.   Numbness: She complains of numbness and tingling in her hands, fingers, and toes that have been occurring for the last month. She complains that her hands are painful. She is loosing her sensation in her fingertips and explains that it is difficult for her to recognize items by touching them when before she had no issues with this. She describes that it does not feel like she has real fingernails when she touches them. She denies any other joint pain in her body. Immunizations: She has received her flu shot in the office today.   Health Maintenance Due  Topic Date Due   HIV Screening  Never done   Hepatitis C Screening  Never done   MAMMOGRAM  Never done   Zoster Vaccines- Shingrix (1 of 2) Never done   COVID-19 Vaccine (4 - Booster for Pfizer series) 07/17/2020   INFLUENZA VACCINE  08/17/2020    Past Medical History:  Diagnosis Date   Anxiety    hx of   B12 deficiency 10/29/2019   Chicken pox    Depression    hx of   Migraines    Shingles 08/06/2020    Past Surgical History:  Procedure Laterality Date   APPENDECTOMY  1990   BACK SURGERY     FOOT FRACTURE SURGERY Bilateral 2019   WISDOM TOOTH EXTRACTION      Family History  Problem Relation Age of Onset   Asthma Mother    Arthritis Mother    Depression Mother    Colon polyps Mother 11   Alcohol abuse Father    Heart disease Father        MI, brain aneurysm   Hyperlipidemia Father    COPD Father    Arthritis Sister    Depression Sister    Colon polyps Sister 45   Alcohol abuse Brother    Asthma  Son    Depression Son    Arthritis Maternal Grandmother    Asthma Maternal Grandmother    COPD Maternal Grandmother    Alcohol abuse Maternal Grandfather    Asthma Maternal Grandfather    COPD Maternal Grandfather    Drug abuse Maternal Grandfather    Hyperlipidemia Maternal Grandfather    Hypertension Maternal Grandfather    Asthma Paternal Grandmother    COPD Paternal Grandmother    Heart attack Paternal Grandfather    Hyperlipidemia Paternal Grandfather    Hypertension Paternal Grandfather    Asthma Son    Depression Son    Colon cancer Neg Hx    Esophageal cancer Neg Hx    Rectal cancer Neg Hx    Stomach cancer Neg Hx     Social History   Socioeconomic History   Marital status: Divorced    Spouse name: Not on file   Number of children: Not on file   Years of education: Not on file   Highest education level: Not on file  Occupational History   Not on file  Tobacco Use   Smoking status: Former  Types: Cigarettes    Start date: 01/27/1985    Quit date: 01/28/2012    Years since quitting: 8.7   Smokeless tobacco: Never  Vaping Use   Vaping Use: Never used  Substance and Sexual Activity   Alcohol use: Yes    Alcohol/week: 2.0 standard drinks    Types: 2 Standard drinks or equivalent per week    Comment: 1 drink/day   Drug use: Never   Sexual activity: Yes    Partners: Male  Other Topics Concern   Not on file  Social History Narrative   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)   Has been married and divorced twice, lives with [name]   2 sons   2004- Will   2007- [name]   Completed some college   Enjoys gardening, games, reading   2 cats and 2 dogs, 2 crabs   Social Determinants of Radio broadcast assistant Strain: Not on file  Food Insecurity: Not on file  Transportation Needs: Not on file  Physical Activity: Not on file  Stress: Not on file  Social Connections: Not on file  Intimate Partner Violence: Not on file    Outpatient Medications Prior  to Visit  Medication Sig Dispense Refill   estradiol-norethindrone (ACTIVELLA) 1-0.5 MG tablet Take 1 tablet by mouth daily. 28 tablet 1   pantoprazole (PROTONIX) 40 MG tablet Take 1 tablet (40 mg total) by mouth daily. (Patient taking differently: Take 40 mg by mouth daily. Prn) 30 tablet 3   SUMAtriptan (IMITREX) 50 MG tablet Take 1 tab by mouth at start of migraine- may repeat in 2 hrs as needed. 10 tablet 5   No facility-administered medications prior to visit.    Allergies  Allergen Reactions   Poison Ivy Extract Dermatitis, Itching and Rash    Review of Systems  Musculoskeletal:  Positive for joint pain (in fingers).       (+) pain in hands  Neurological:  Positive for tingling.  Endo/Heme/Allergies:  Environmental allergies: in hands, fingers and toes.      Objective:    Physical Exam Constitutional:      General: She is not in acute distress.    Appearance: Normal appearance. She is not ill-appearing.  HENT:     Head: Normocephalic and atraumatic.     Right Ear: External ear normal.     Left Ear: External ear normal.  Eyes:     Extraocular Movements: Extraocular movements intact.     Pupils: Pupils are equal, round, and reactive to light.  Cardiovascular:     Rate and Rhythm: Normal rate and regular rhythm.     Heart sounds: Normal heart sounds. No murmur heard.   No gallop.  Pulmonary:     Effort: Pulmonary effort is normal. No respiratory distress.     Breath sounds: Normal breath sounds. No wheezing or rales.  Musculoskeletal:     Comments: (+) 5/5 upper extremity strength  Skin:    General: Skin is warm and dry.  Neurological:     Mental Status: She is alert and oriented to person, place, and time.     Comments: (+) tinel's test bilaterally (+) decreased sensation bilateral in first, second, third fingers, as well as medially on both small fingers. (+) decreased sensation with mono filament in both great toes, first and second toes.   Psychiatric:         Behavior: Behavior normal.        Judgment: Judgment normal.    BP 117/63 (  BP Location: Right Arm, Cuff Size: Normal)   Pulse 88   Temp 98.6 F (37 C) (Oral)   Resp 12   Ht 5' 7.5" (1.715 m)   Wt 177 lb 6.4 oz (80.5 kg)   LMP 07/24/2020   SpO2 98%   BMI 27.37 kg/m  Wt Readings from Last 3 Encounters:  10/20/20 177 lb 6.4 oz (80.5 kg)  10/14/20 181 lb (82.1 kg)  08/21/20 177 lb (80.3 kg)       Assessment & Plan:   Problem List Items Addressed This Visit       Unprioritized   Numbness    Numbness in feet seems to correlate with L5 dermatome. She does have a history of lumbar radiculopathy.  Will see how she does with meloxicam. Further evaluation per neurology.       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 Recommended the following:  Please wear wrist splints while sleeping and as able throughout the day. Begin Meloxicam 7.5mg  once daily (anti-inflammatory) You should be contacted about your referral to Neurology.       B12 deficiency - Primary    B12 was drawn immediately following her b12 injection last draw.  She has not had an injection in 2 months. Will draw b12/folate and then plan to give her b12 injection today.       Relevant Orders   B12   Folate   Meds ordered this encounter  Medications   meloxicam (MOBIC) 7.5 MG tablet    Sig: Take 1 tablet (7.5 mg total) by mouth daily.    Dispense:  30 tablet    Refill:  0    Order Specific Question:   Supervising Provider    Answer:   Penni Homans A [[id number]]    I, [name], NP, personally preformed the services described in this documentation.  All medical record entries made by the scribe were at my direction and in my presence.  I have reviewed the chart and discharge instructions (if applicable) and agree that the record reflects my personal performance and is accurate and complete.  10/20/2020  I,Lyric Barr-McArthur,acting as a Education administrator for Nance Pear, NP.,have documented all relevant documentation on the behalf of Nance Pear, NP,as directed by  Nance Pear, NP while in the presence of Nance Pear, NP.  Nance Pear, NP

## 2020-10-20 NOTE — Patient Instructions (Signed)
Please wear wrist splints while sleeping and as able throughout the day. Begin Meloxicam 7.5mg  once daily (anti-inflammatory) You should be contacted about your referral to Neurology.

## 2020-10-20 NOTE — Assessment & Plan Note (Signed)
B12 was drawn immediately following her b12 injection last draw.  She has not had an injection in 2 months. Will draw b12/folate and then plan to give her b12 injection today.

## 2020-10-21 ENCOUNTER — Telehealth (HOSPITAL_BASED_OUTPATIENT_CLINIC_OR_DEPARTMENT_OTHER): Payer: Self-pay

## 2020-10-21 ENCOUNTER — Telehealth: Payer: Self-pay | Admitting: Family

## 2020-10-21 LAB — FOLATE: Folate: 23.1 ng/mL (ref >5.9)

## 2020-10-21 LAB — VITAMIN B12: Vitamin B-12: 86 pg/mL — ABNORMAL LOW (ref 211–911)

## 2020-10-21 NOTE — Telephone Encounter (Signed)
B12 is super low.  I would like her to do b12 injections 1000 mcg once weekly x 4 weeks, then once monthly, repeat b12 in 3 months.  I do think that this can be contributing to her numbness symptoms.

## 2020-10-23 NOTE — Telephone Encounter (Signed)
Patient called back, staff was attending to other patients. She was advised to wait for a call back from [place].

## 2020-10-23 NOTE — Telephone Encounter (Signed)
Called patient back but no answer, left detail message about her B12 being low. She needs to be scheduled for a B12 shot nurse visit to start B12 injections once a week for 4 weeks.

## 2020-10-23 NOTE — Telephone Encounter (Signed)
Called patient but no answer, lvm for her to call us back

## 2020-10-26 ENCOUNTER — Other Ambulatory Visit: Payer: Self-pay | Admitting: Family

## 2020-10-26 NOTE — Telephone Encounter (Signed)
Patient reports she called  back and is scheduled to have her first B12 on Friday 10-30-2020

## 2020-10-28 ENCOUNTER — Ambulatory Visit: Payer: Managed Care, Other (non HMO)

## 2020-10-30 ENCOUNTER — Encounter: Payer: Self-pay | Admitting: Family

## 2020-10-30 ENCOUNTER — Ambulatory Visit (INDEPENDENT_AMBULATORY_CARE_PROVIDER_SITE_OTHER): Payer: Managed Care, Other (non HMO) | Admitting: Neurology

## 2020-10-30 ENCOUNTER — Encounter: Payer: Self-pay | Admitting: Neurology

## 2020-10-30 ENCOUNTER — Ambulatory Visit (INDEPENDENT_AMBULATORY_CARE_PROVIDER_SITE_OTHER): Payer: Managed Care, Other (non HMO)

## 2020-10-30 ENCOUNTER — Other Ambulatory Visit: Payer: Self-pay

## 2020-10-30 VITALS — BP 129/81 | HR 66 | Ht 67.5 in | Wt 184.0 lb

## 2020-10-30 DIAGNOSIS — R200 Anesthesia of skin: Secondary | ICD-10-CM

## 2020-10-30 DIAGNOSIS — E538 Deficiency of other specified B group vitamins: Secondary | ICD-10-CM

## 2020-10-30 MED ORDER — CYANOCOBALAMIN 1000 MCG/ML IJ SOLN
1000.0000 ug | Freq: Once | INTRAMUSCULAR | Status: AC
  Administered 2020-10-30: 1000 ug via INTRAMUSCULAR

## 2020-10-30 NOTE — Progress Notes (Signed)
Susan Glover is a 55 y.o. female presents to the office today for B12 injection per physician's orders. Original order: 10/21/20: "Called patient back but no answer, left detail message about her B12 being low. She needs to be scheduled for a B12 shot nurse visit to start B12 injections once a week for 4 weeks." B12, 1000 mcg (dose),  IM (route) was administered L Deltoid (location) today. Patient tolerated injection. Patient due for follow up labs/provider appt: No. Date due: Pending appointment on 11/27/20 with [name], appt made No Patient next injection due: 1 week, appt made Yes- Pt will send MyChart message to [facility] about starting injections at home for convince.   Creft, Darlis Loan

## 2020-10-30 NOTE — Progress Notes (Signed)
[facility]  PATIENT: Susan Glover DOB: 31-Dec-1965  REFERRING CLINICIAN: [name], NP HISTORY FROM: Patient  REASON FOR VISIT: Bilateral hand and feet numbness    HISTORICAL  CHIEF COMPLAINT:  Chief Complaint  Patient presents with   New Patient (Initial Visit)    RM 12, alone. Internal referral from [name], NP (PCP) for numbness in both hands and bilateral toes. Hx of b12 deficiency/being treated with b12 injections (started a couple weeks ago and just transitioned to weekly). Had back surgery back in November. Unsure if low B12 or hx back surgery causing sx.    HISTORY OF PRESENT ILLNESS:  This is a 55 year old woman with past medical history of anxiety/depression, migraine headaches, shingles and vitamin B12 deficiency who is presenting with complaint of numbness in the bilateral hands and feet.  Patient noted the numbness in the past 40-month she describes it as constant numbness with pain in the palm of her hand.  Denies any history of neck pain, but reported she had back surgery in November 2021 due to lumbar stenosis and bulging disc.  In terms of her B12 deficiency she reported being diagnosed since last year, at that time she was getting monthly shot but her recent B12 level done a week ago was 86.  She did follow-up with her primary care doctor and started restarted on weekly B12 injection.  Patient also complains of worsening anxiety, difficulty with concentration, difficulty finishing her work and memory problem.    OTHER MEDICAL CONDITIONS: Anxiety/depression, migraine headaches, shingles, and vitamin B12 deficiency.   REVIEW OF SYSTEMS: Full 14 system review of systems performed and negative with exception of: As noted in the HPI.  ALLERGIES: Allergies  Allergen Reactions   Poison Ivy Extract Dermatitis, Itching and Rash    HOME MEDICATIONS: Outpatient Medications Prior to Visit  Medication Sig Dispense Refill    Cyanocobalamin (B-12 COMPLIANCE INJECTION) 1000 MCG/ML KIT Inject 1,000 mcg as directed once a week.     estradiol-norethindrone (ACTIVELLA) 1-0.5 MG tablet Take 1 tablet by mouth daily. 28 tablet 1   pantoprazole (PROTONIX) 40 MG tablet TAKE 1 TABLET BY MOUTH EVERY DAY 90 tablet 1   SUMAtriptan (IMITREX) 50 MG tablet Take 1 tab by mouth at start of migraine- may repeat in 2 hrs as needed. 10 tablet 5   meloxicam (MOBIC) 7.5 MG tablet Take 1 tablet (7.5 mg total) by mouth daily. (Patient not taking: Reported on 10/30/2020) 30 tablet 0   No facility-administered medications prior to visit.    PAST MEDICAL HISTORY: Past Medical History:  Diagnosis Date   Anxiety    hx of   B12 deficiency 10/29/2019   Chicken pox    Depression    hx of   Migraines    Shingles 08/06/2020    PAST SURGICAL HISTORY: Past Surgical History:  Procedure Laterality Date   APPENDECTOMY  1990   BACK SURGERY     FOOT FRACTURE SURGERY Bilateral 2019   WISDOM TOOTH EXTRACTION      FAMILY HISTORY: Family History  Problem Relation Age of Onset   Asthma Mother    Arthritis Mother    Depression Mother    Colon polyps Mother 558  Alcohol abuse Father    Heart disease Father        MI, brain aneurysm   Hyperlipidemia Father    COPD Father    Arthritis Sister    Depression Sister    Colon polyps Sister 454  Alcohol abuse Brother    Asthma Son    Depression Son    Arthritis Maternal Grandmother    Asthma Maternal Grandmother    COPD Maternal Grandmother    Alcohol abuse Maternal Grandfather    Asthma Maternal Grandfather    COPD Maternal Grandfather    Drug abuse Maternal Grandfather    Hyperlipidemia Maternal Grandfather    Hypertension Maternal Grandfather    Asthma Paternal Grandmother    COPD Paternal Grandmother    Heart attack Paternal Grandfather    Hyperlipidemia Paternal Grandfather    Hypertension Paternal Grandfather    Asthma Son    Depression Son    Colon cancer Neg Hx     Esophageal cancer Neg Hx    Rectal cancer Neg Hx    Stomach cancer Neg Hx     SOCIAL HISTORY: Social History   Socioeconomic History   Marital status: Divorced    Spouse name: Not on file   Number of children: Not on file   Years of education: Not on file   Highest education level: Not on file  Occupational History   Not on file  Tobacco Use   Smoking status: Former    Types: Cigarettes    Start date: 01/27/1985    Quit date: 01/28/2012    Years since quitting: 8.7   Smokeless tobacco: Never  Vaping Use   Vaping Use: Never used  Substance and Sexual Activity   Alcohol use: Yes    Alcohol/week: 2.0 standard drinks    Types: 2 Standard drinks or equivalent per week    Comment: 2-3 drinks per week or less   Drug use: Never   Sexual activity: Yes    Partners: Male  Other Topics Concern   Not on file  Social History Narrative   Right handed   1 cup coffee/day   1-2 sodas daily or less   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)   Has been married and divorced twice, lives with [name]   2 sons   2004- Will   2007- [name]   Completed some college   Enjoys gardening, games, reading   2 cats and 2 dogs, 2 crabs   Social Determinants of Radio broadcast assistant Strain: Not on file  Food Insecurity: Not on file  Transportation Needs: Not on file  Physical Activity: Not on file  Stress: Not on file  Social Connections: Not on file  Intimate Partner Violence: Not on file     PHYSICAL EXAM  GENERAL EXAM/CONSTITUTIONAL: Vitals:  Vitals:   10/30/20 1017  BP: 129/81  Pulse: 66  Weight: 184 lb (83.5 kg)  Height: 5' 7.5" (1.715 m)   Body mass index is 28.39 kg/m. Wt Readings from Last 3 Encounters:  10/30/20 184 lb (83.5 kg)  10/20/20 177 lb 6.4 oz (80.5 kg)  10/14/20 181 lb (82.1 kg)   Patient is in no distress; well developed, nourished and groomed; neck is supple  CARDIOVASCULAR: Examination of carotid arteries is normal; no carotid bruits Regular rate  and rhythm, no murmurs Examination of peripheral vascular system by observation and palpation is normal  EYES: Pupils round and reactive to light, Visual fields full to confrontation, Extraocular movements intacts,   MUSCULOSKELETAL: Gait, strength, tone, movements noted in Neurologic exam below  NEUROLOGIC: MENTAL STATUS:  No flowsheet data found. awake, alert, oriented to person, place and time recent and remote memory intact normal attention and concentration language fluent, comprehension intact, naming intact fund of knowledge appropriate  CRANIAL NERVE:  2nd - no papilledema or hemorrhages on fundoscopic exam 2nd, 3rd, 4th, 6th - pupils equal and reactive to light, visual fields full to confrontation, extraocular muscles intact, no nystagmus 5th - facial sensation symmetric 7th - facial strength symmetric 8th - hearing intact 9th - palate elevates symmetrically, uvula midline 11th - shoulder shrug symmetric 12th - tongue protrusion midline  MOTOR:  normal bulk and tone, full strength in the BUE, BLE  SENSORY:  normal and symmetric to light touch, decrease vibration up to ankles bilaterally.   COORDINATION:  finger-nose-finger, fine finger movements normal  REFLEXES:  deep tendon reflexes present and symmetric  GAIT/STATION:  normal    DIAGNOSTIC DATA (LABS, IMAGING, TESTING) - I reviewed patient records, labs, notes, testing and imaging myself where available.  Lab Results  Component Value Date   WBC 6.2 06/17/2020   HGB 12.3 06/17/2020   HCT 35.2 (L) 06/17/2020   MCV 94.1 06/17/2020   PLT 312 06/17/2020      Component Value Date/Time   NA 137 06/17/2020 1856   K 4.1 06/17/2020 1856   CL 108 06/17/2020 1856   CO2 24 06/17/2020 1856   GLUCOSE 97 06/17/2020 1856   BUN 10 06/17/2020 1856   CREATININE 0.80 06/17/2020 1856   CREATININE 0.83 10/28/2019 0806   CALCIUM 8.7 (L) 06/17/2020 1856   PROT 6.9 06/17/2020 1856   ALBUMIN 3.7 06/17/2020 1856    AST 34 06/17/2020 1856   ALT 23 06/17/2020 1856   ALKPHOS 82 06/17/2020 1856   BILITOT 0.9 06/17/2020 1856   GFRNONAA >60 06/17/2020 1856   No results found for: CHOL, HDL, LDLCALC, LDLDIRECT, TRIG, CHOLHDL No results found for: HGBA1C Lab Results  Component Value Date   VITAMINB12 86 (L) 10/20/2020   Lab Results  Component Value Date   TSH 2.74 10/28/2019     ASSESSMENT AND PLAN  55 y.o. year old female with anxiety/depression, migraine headache, shingles and vitamin B12 deficiency who is presenting with complaint of numbness and tingling in both hands and feet.  Patient reports symptom has been constant for the past month, nothing seems to make it better or worse.  She also has a history of B12 deficiency initially treated with B12 injection monthly but her recent B12 on October 4 was 86.  She was restarted weekly B12 injection.  She was noted to have a decrease in vibratory sense is up to ankle bilaterally. I informed patient that her numbness, difficulty concentration, worsening anxiety depression is likely secondary to a B12 deficiency.  Advised her to continue with the B12 injection and to come back in 27-monthfor a reevaluation.  If she continues to have numbness in the hands and feet will obtain a nerve conduction study.  She is agreeable with plans.  I will see her for follow-up in 3 months   1. B12 deficiency   2. Numbness     PLAN: Continue with B12 supplement  Return in 3 months, if symptoms not improved, will obtain EMG/NCS   No orders of the defined types were placed in this encounter.   No orders of the defined types were placed in this encounter.   Return in about 3 months (around 01/30/2021).    AAlric Ran MD 10/30/2020, 11:44 AM  [facility] [address] [place] [phone]

## 2020-10-30 NOTE — Patient Instructions (Signed)
Continue with B12 supplement  Return in 3 months, if symptoms not improved, will obtain EMG/NCS

## 2020-10-31 MED ORDER — "SYRINGE 25G X 1-1/2"" 3 ML MISC": 0 refills | Status: AC

## 2020-10-31 MED ORDER — CYANOCOBALAMIN 1000 MCG/ML IJ SOLN: INTRAMUSCULAR | 0 refills | Status: DC

## 2020-11-02 NOTE — Addendum Note (Signed)
Addended by: [name] on: 11/02/2020 09:48 AM   Modules accepted: Orders

## 2020-11-03 ENCOUNTER — Other Ambulatory Visit: Payer: Self-pay

## 2020-11-03 ENCOUNTER — Ambulatory Visit (INDEPENDENT_AMBULATORY_CARE_PROVIDER_SITE_OTHER): Payer: Managed Care, Other (non HMO)

## 2020-11-03 ENCOUNTER — Other Ambulatory Visit (INDEPENDENT_AMBULATORY_CARE_PROVIDER_SITE_OTHER): Payer: Managed Care, Other (non HMO)

## 2020-11-03 DIAGNOSIS — E538 Deficiency of other specified B group vitamins: Secondary | ICD-10-CM

## 2020-11-03 MED ORDER — CYANOCOBALAMIN 1000 MCG/ML IJ SOLN
1000.0000 ug | Freq: Once | INTRAMUSCULAR | Status: AC
  Administered 2020-11-03: 1000 ug via INTRAMUSCULAR

## 2020-11-03 NOTE — Progress Notes (Signed)
Pt here for weekly B12 injection per PCP orders.   B12 106mcg given IM, and pt tolerated injection well.  Educated on self administration. Patient boyfriend will be administering injections, states he is comfortable with procedure.   Gerilyn Nestle

## 2020-11-05 LAB — INTRINSIC FACTOR ANTIBODIES: Intrinsic Factor: POSITIVE — AB

## 2020-11-06 ENCOUNTER — Telehealth: Payer: Self-pay | Admitting: Family

## 2020-11-06 DIAGNOSIS — D510 Vitamin B12 deficiency anemia due to intrinsic factor deficiency: Secondary | ICD-10-CM | POA: Insufficient documentation

## 2020-11-06 NOTE — Telephone Encounter (Signed)
See mychart.  

## 2020-11-10 DIAGNOSIS — N951 Menopausal and female climacteric states: Secondary | ICD-10-CM

## 2020-11-11 MED ORDER — ESTRADIOL-NORETHINDRONE ACET 1-0.5 MG PO TABS: 1.0000 | ORAL_TABLET | Freq: Every day | ORAL | 3 refills | Status: DC

## 2020-11-19 ENCOUNTER — Other Ambulatory Visit: Payer: Self-pay

## 2020-11-19 ENCOUNTER — Ambulatory Visit (INDEPENDENT_AMBULATORY_CARE_PROVIDER_SITE_OTHER): Payer: Managed Care, Other (non HMO)

## 2020-11-19 DIAGNOSIS — Z1231 Encounter for screening mammogram for malignant neoplasm of breast: Secondary | ICD-10-CM

## 2020-11-19 DIAGNOSIS — Z01419 Encounter for gynecological examination (general) (routine) without abnormal findings: Secondary | ICD-10-CM

## 2020-11-20 ENCOUNTER — Ambulatory Visit (INDEPENDENT_AMBULATORY_CARE_PROVIDER_SITE_OTHER): Payer: 59 | Admitting: Psychology

## 2020-11-20 DIAGNOSIS — F89 Unspecified disorder of psychological development: Secondary | ICD-10-CM

## 2020-11-21 ENCOUNTER — Other Ambulatory Visit: Payer: Self-pay | Admitting: Family

## 2020-11-23 ENCOUNTER — Ambulatory Visit: Payer: Managed Care, Other (non HMO) | Admitting: Family

## 2020-11-27 ENCOUNTER — Ambulatory Visit (INDEPENDENT_AMBULATORY_CARE_PROVIDER_SITE_OTHER): Payer: Managed Care, Other (non HMO) | Admitting: Family

## 2020-11-27 ENCOUNTER — Other Ambulatory Visit: Payer: Self-pay

## 2020-11-27 VITALS — BP 139/84 | HR 70 | Temp 98.4°F | Resp 16 | Ht 67.3 in | Wt 182.0 lb

## 2020-11-27 DIAGNOSIS — R200 Anesthesia of skin: Secondary | ICD-10-CM | POA: Diagnosis not present

## 2020-11-27 DIAGNOSIS — E538 Deficiency of other specified B group vitamins: Secondary | ICD-10-CM

## 2020-11-27 DIAGNOSIS — R232 Flushing: Secondary | ICD-10-CM | POA: Diagnosis not present

## 2020-11-27 DIAGNOSIS — K219 Gastro-esophageal reflux disease without esophagitis: Secondary | ICD-10-CM

## 2020-11-27 DIAGNOSIS — R41840 Attention and concentration deficit: Secondary | ICD-10-CM

## 2020-11-27 LAB — VITAMIN B12: Vitamin B-12: 409 pg/mL (ref 211–911)

## 2020-11-27 NOTE — Assessment & Plan Note (Signed)
She will be working with a therapist and this has been scheduled.

## 2020-11-27 NOTE — Assessment & Plan Note (Signed)
Stable with [place], monitor.

## 2020-11-27 NOTE — Assessment & Plan Note (Signed)
Fair control. I advised her to discontinue meloxicam as she has some GI upset.  She will plan to only use an otc NSAID sparingly. Continue protonix.

## 2020-11-27 NOTE — Progress Notes (Signed)
Subjective:   By signing my name below, I, Lyric Barr-McArthur, attest that this documentation has been prepared under the direction and in the presence of [name], NP, 11/27/2020   Patient ID: Susan Glover, female    DOB: 04-26-1965, 55 y.o.   MRN: [id number]  Chief Complaint  Patient presents with   B 12 deficiency    Here for follow up, doing shots of B12 at home   Gastroesophageal Reflux    Here for follow up    HPI Patient is in today for an office visit.  Numbness and tingling: She has been receiving B12 injections at home. She notes that she is still experiencing a tingling feeling in the nerves in her back. She does mention that the numbness in her fingers and toes has improved but does note that it has not completely gone away.  Reflux: She is still experiencing reflux from time to time. She notes that when she does not eat she feels nauseous and even when she does eat she feels nauseous. She has been taking 40 mg Protonix as needed to help with this. She is interested in seeing a gastroenterologist to help with the symptoms of her reflux.  Inflammation: She has been taking 7.5 mg Meloxicam for the pain in her back from her surgery. She mentions that she is unsure if the Meloxicam is helping or if it is the OTC ibuprofen and Advil.  Migraines: She notes an improvement in her migraines and believes that they were attributed to her menstrual cycle.  Menopause: She has been working with a gynecologist to manage the symptoms of menopause. She has been taking 0.5 estradiol to help with this as well.  ADHD: She mentions that she has been seeing her therapist for her ADHD and they have this under control.  Health Maintenance Due  Topic Date Due   HIV Screening  Never done   Hepatitis C Screening  Never done   TETANUS/TDAP  Never done   Zoster Vaccines- Shingrix (1 of 2) Never done   COVID-19 Vaccine (3 - Booster for [employer] series) 07/09/2019    Past Medical History:   Diagnosis Date   Anxiety    hx of   B12 deficiency 10/29/2019   Chicken pox    Depression    hx of   Migraines    Shingles 08/06/2020    Past Surgical History:  Procedure Laterality Date   APPENDECTOMY  1990   BACK SURGERY     FOOT FRACTURE SURGERY Bilateral 2019   WISDOM TOOTH EXTRACTION      Family History  Problem Relation Age of Onset   Asthma Mother    Arthritis Mother    Depression Mother    Colon polyps Mother 104   Alcohol abuse Father    Heart disease Father        MI, brain aneurysm   Hyperlipidemia Father    COPD Father    Arthritis Sister    Depression Sister    Colon polyps Sister 82   Breast cancer Maternal Aunt    Arthritis Maternal Grandmother    Asthma Maternal Grandmother    COPD Maternal Grandmother    Alcohol abuse Maternal Grandfather    Asthma Maternal Grandfather    COPD Maternal Grandfather    Drug abuse Maternal Grandfather    Hyperlipidemia Maternal Grandfather    Hypertension Maternal Grandfather    Asthma Paternal Grandmother    COPD Paternal Grandmother    Heart attack Paternal  Grandfather    Hyperlipidemia Paternal Grandfather    Hypertension Paternal Grandfather    Alcohol abuse Brother    Asthma Son    Depression Son    Asthma Son    Depression Son    Colon cancer Neg Hx    Esophageal cancer Neg Hx    Rectal cancer Neg Hx    Stomach cancer Neg Hx     Social History   Socioeconomic History   Marital status: Divorced    Spouse name: Not on file   Number of children: Not on file   Years of education: Not on file   Highest education level: Not on file  Occupational History   Not on file  Tobacco Use   Smoking status: Former    Types: Cigarettes    Start date: 01/27/1985    Quit date: 01/28/2012    Years since quitting: 8.8   Smokeless tobacco: Never  Vaping Use   Vaping Use: Never used  Substance and Sexual Activity   Alcohol use: Yes    Alcohol/week: 2.0 standard drinks    Types: 2 Standard drinks or  equivalent per week    Comment: 2-3 drinks per week or less   Drug use: Never   Sexual activity: Yes    Partners: Male  Other Topics Concern   Not on file  Social History Narrative   Right handed   1 cup coffee/day   1-2 sodas daily or less   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)   Has been married and divorced twice, lives with [name]   2 sons   2004- Will   2007- [name]   Completed some college   Enjoys gardening, games, reading   2 cats and 2 dogs, 2 crabs   Social Determinants of Radio broadcast assistant Strain: Not on file  Food Insecurity: Not on file  Transportation Needs: Not on file  Physical Activity: Not on file  Stress: Not on file  Social Connections: Not on file  Intimate Partner Violence: Not on file    Outpatient Medications Prior to Visit  Medication Sig Dispense Refill   cyanocobalamin (,VITAMIN B-12,) 1000 MCG/ML injection Inject 1027mcg into the muscle once weekly for 4 weeks, then once monthly. 12 mL 0   estradiol-norethindrone (ACTIVELLA) 1-0.5 MG tablet Take 1 tablet by mouth daily. 90 tablet 3   pantoprazole (PROTONIX) 40 MG tablet TAKE 1 TABLET BY MOUTH EVERY DAY 90 tablet 1   SUMAtriptan (IMITREX) 50 MG tablet Take 1 tab by mouth at start of migraine- may repeat in 2 hrs as needed. 10 tablet 5   Syringe/Needle, Disp, (SYRINGE 3CC/25GX1-1/2") 25G X 1-1/2" 3 ML MISC Use as directed 25 each 0   meloxicam (MOBIC) 7.5 MG tablet TAKE 1 TABLET BY MOUTH EVERY DAY 30 tablet 0   No facility-administered medications prior to visit.    Allergies  Allergen Reactions   Poison Ivy Extract Dermatitis, Itching and Rash    Review of Systems  Gastrointestinal:  Positive for nausea.  Musculoskeletal:  Positive for back pain.  Neurological:  Positive for tingling (in back).       (+) numbness in fingers and toes      Objective:    Physical Exam Constitutional:      General: She is not in acute distress.    Appearance: Normal appearance. She is not  ill-appearing.  HENT:     Head: Normocephalic and atraumatic.     Right Ear: External ear normal.  Left Ear: External ear normal.  Eyes:     Extraocular Movements: Extraocular movements intact.     Pupils: Pupils are equal, round, and reactive to light.  Cardiovascular:     Rate and Rhythm: Normal rate and regular rhythm.     Heart sounds: Normal heart sounds. No murmur heard.   No gallop.  Pulmonary:     Effort: Pulmonary effort is normal. No respiratory distress.     Breath sounds: Normal breath sounds. No wheezing or rales.  Lymphadenopathy:     Cervical: No cervical adenopathy.  Skin:    General: Skin is warm and dry.  Neurological:     Mental Status: She is alert and oriented to person, place, and time.  Psychiatric:        Behavior: Behavior normal.        Judgment: Judgment normal.    BP 139/84 (BP Location: Right Arm, Patient Position: Sitting, Cuff Size: Small)   Pulse 70   Temp 98.4 F (36.9 C) (Oral)   Resp 16   Ht 5' 7.3" (1.709 m)   Wt 182 lb (82.6 kg)   LMP 07/24/2020   SpO2 100%   BMI 28.25 kg/m  Wt Readings from Last 3 Encounters:  11/27/20 182 lb (82.6 kg)  10/30/20 184 lb (83.5 kg)  10/20/20 177 lb 6.4 oz (80.5 kg)       Assessment & Plan:   Problem List Items Addressed This Visit       Unprioritized   Numbness    Symptoms are improved but not resolved.       Hot flashes    Stable with Estroven, monitor.       Gastroesophageal reflux disease    Fair control. I advised her to discontinue meloxicam as she has some GI upset.  She will plan to only use an otc NSAID sparingly. Continue protonix.       B12 deficiency - Primary    Improving some clinically. Continue b12 injections, check follow up b12 level.       Relevant Orders   B12   Attention deficit    She will be working with a therapist and this has been scheduled.       No orders of the defined types were placed in this encounter.   I, [name], NP,  personally preformed the services described in this documentation.  All medical record entries made by the scribe were at my direction and in my presence.  I have reviewed the chart and discharge instructions (if applicable) and agree that the record reflects my personal performance and is accurate and complete. 11/27/2020  I,Lyric Barr-McArthur,acting as a Education administrator for Nance Pear, NP.,have documented all relevant documentation on the behalf of Nance Pear, NP,as directed by  Nance Pear, NP while in the presence of Nance Pear, NP.  Nance Pear, NP

## 2020-11-27 NOTE — Assessment & Plan Note (Signed)
Symptoms are improved but not resolved.

## 2020-11-27 NOTE — Assessment & Plan Note (Signed)
Improving some clinically. Continue b12 injections, check follow up b12 level.

## 2020-12-04 ENCOUNTER — Ambulatory Visit: Payer: 59 | Admitting: Psychology

## 2020-12-17 ENCOUNTER — Ambulatory Visit (INDEPENDENT_AMBULATORY_CARE_PROVIDER_SITE_OTHER): Payer: Managed Care, Other (non HMO) | Admitting: Family Medicine

## 2020-12-17 ENCOUNTER — Other Ambulatory Visit: Payer: Self-pay

## 2020-12-17 VITALS — BP 118/79 | HR 67 | Wt 182.0 lb

## 2020-12-17 DIAGNOSIS — F321 Major depressive disorder, single episode, moderate: Secondary | ICD-10-CM

## 2020-12-17 DIAGNOSIS — N951 Menopausal and female climacteric states: Secondary | ICD-10-CM

## 2020-12-17 MED ORDER — BUPROPION HCL ER (XL) 150 MG PO TB24: 150.0000 mg | ORAL_TABLET | Freq: Every day | ORAL | 11 refills | Status: DC

## 2020-12-17 NOTE — Progress Notes (Signed)
   Subjective:    Patient ID: Susan Glover, female    DOB: October 04, 1965, 55 y.o.   MRN: [id number]  HPI Patient doing well on HRT that was started last appt. Notices less frequent hot flashes, night sweats. Has noticed that she has continued to have significant depression, which has continued without improvement. Over past 2 weeks, has had one "good" day. Has significant anhedonia, some difficulty falling asleep. Was treated with effexor 20-30 years ago for depression/anxiety. Is currently seeing psychiatrist for ADHD.    Review of Systems     Objective:  BP 118/79   Pulse 67   Wt 182 lb (82.6 kg)   LMP 07/24/2020   BMI 28.25 kg/m    Physical Exam Vitals reviewed.  Constitutional:      Appearance: Normal appearance.  Skin:    General: Skin is warm.     Capillary Refill: Capillary refill takes less than 2 seconds.  Neurological:     General: No focal deficit present.     Mental Status: She is alert and oriented to person, place, and time.  Psychiatric:        Mood and Affect: Mood normal.        Behavior: Behavior normal.        Thought Content: Thought content normal.        Judgment: Judgment normal.      Assessment & Plan:  1. Vasomotor symptoms due to menopause Continue HFT.  On Activella 1mg /0.5mg   2. Current moderate episode of major depressive disorder, unspecified whether recurrent ([place]) Discussed adding medication for depression - will start Wellbutrin 150mg  daily. I discussed that her psychiatrist may want to alter this, depending on treatment plan for ADHD. Discussed potential side effects of medication and that she may not notice any changes over the next couple of weeks. F/u in 2 months.

## 2020-12-18 ENCOUNTER — Ambulatory Visit (INDEPENDENT_AMBULATORY_CARE_PROVIDER_SITE_OTHER): Payer: 59 | Admitting: Psychology

## 2020-12-18 DIAGNOSIS — F331 Major depressive disorder, recurrent, moderate: Secondary | ICD-10-CM

## 2020-12-18 DIAGNOSIS — F902 Attention-deficit hyperactivity disorder, combined type: Secondary | ICD-10-CM | POA: Diagnosis not present

## 2020-12-18 DIAGNOSIS — F411 Generalized anxiety disorder: Secondary | ICD-10-CM | POA: Diagnosis not present

## 2021-01-05 ENCOUNTER — Encounter: Payer: Self-pay | Admitting: Family

## 2021-01-05 DIAGNOSIS — D510 Vitamin B12 deficiency anemia due to intrinsic factor deficiency: Secondary | ICD-10-CM

## 2021-01-05 MED ORDER — CYANOCOBALAMIN 1000 MCG/ML IJ SOLN
1000.0000 ug | INTRAMUSCULAR | 0 refills | Status: DC
Start: 2021-01-05 — End: 2021-02-09

## 2021-01-12 ENCOUNTER — Other Ambulatory Visit: Payer: Self-pay | Admitting: Family Medicine

## 2021-02-02 ENCOUNTER — Ambulatory Visit: Payer: Managed Care, Other (non HMO) | Admitting: Neurology

## 2021-02-04 ENCOUNTER — Ambulatory Visit: Payer: Managed Care, Other (non HMO) | Admitting: Neurology

## 2021-02-09 ENCOUNTER — Telehealth: Payer: Self-pay | Admitting: Family

## 2021-02-09 ENCOUNTER — Other Ambulatory Visit (INDEPENDENT_AMBULATORY_CARE_PROVIDER_SITE_OTHER): Payer: Managed Care, Other (non HMO)

## 2021-02-09 DIAGNOSIS — D510 Vitamin B12 deficiency anemia due to intrinsic factor deficiency: Secondary | ICD-10-CM | POA: Diagnosis not present

## 2021-02-09 LAB — VITAMIN B12: Vitamin B-12: 216 pg/mL (ref 211–911)

## 2021-02-09 MED ORDER — CYANOCOBALAMIN 1000 MCG/ML IJ SOLN: 1000.0000 ug | INTRAMUSCULAR | 3 refills | Status: DC

## 2021-02-09 NOTE — Telephone Encounter (Signed)
B12 is still on the low end. Let's increase to 1033mcg IM weekly and repeat b12 in 1 month. I sent refills to her pharmacy.

## 2021-02-10 NOTE — Telephone Encounter (Signed)
Called but no answer, lvm for patient to call back about results °

## 2021-02-16 ENCOUNTER — Ambulatory Visit: Payer: Managed Care, Other (non HMO) | Admitting: Family

## 2021-02-18 ENCOUNTER — Other Ambulatory Visit: Payer: Self-pay

## 2021-02-18 ENCOUNTER — Ambulatory Visit (INDEPENDENT_AMBULATORY_CARE_PROVIDER_SITE_OTHER): Payer: Managed Care, Other (non HMO) | Admitting: Family Medicine

## 2021-02-18 VITALS — BP 122/85 | HR 76 | Wt 173.0 lb

## 2021-02-18 DIAGNOSIS — F321 Major depressive disorder, single episode, moderate: Secondary | ICD-10-CM | POA: Diagnosis not present

## 2021-02-18 DIAGNOSIS — N951 Menopausal and female climacteric states: Secondary | ICD-10-CM | POA: Diagnosis not present

## 2021-02-18 MED ORDER — ALPRAZOLAM 0.5 MG PO TABS: 0.5000 mg | ORAL_TABLET | Freq: Every evening | ORAL | 0 refills | Status: DC | PRN

## 2021-02-18 NOTE — Progress Notes (Signed)
° °  Subjective:    Patient ID: Susan Glover, female    DOB: May 25, 1965, 56 y.o.   MRN: [id number]  HPI Patient seen for follow-up of mood. Last visit took place December, where she was started on Wellbutrin to help with mood dysregulation during menopause.  She is also currently taking Activella, which she is tolerating well.  Last week, she and her boyfriend of 6 years broke up.  She is having a lot of grief and depression surrounding the break-up.  She is currently stressed about housing and work.  She does have some options that she is looking into.  Up until then, she felt like the medicine had started to work and felt like things were improving.  In the past, she has been on Xanax that she takes periodically.  She did want to know if I can refill this.  Review of Systems     Objective:   Physical Exam Vitals reviewed.  Constitutional:      Appearance: Normal appearance.  HENT:     Head: Normocephalic and atraumatic.  Cardiovascular:     Rate and Rhythm: Normal rate.  Skin:    General: Skin is warm and dry.     Capillary Refill: Capillary refill takes less than 2 seconds.  Neurological:     General: No focal deficit present.     Mental Status: She is alert and oriented to person, place, and time.  Psychiatric:        Mood and Affect: Mood normal.        Behavior: Behavior normal.        Thought Content: Thought content normal.        Judgment: Judgment normal.      Assessment & Plan:   1. Vasomotor symptoms due to menopause   2. Current moderate episode of major depressive disorder, unspecified whether recurrent ([place])    Discussed options. Could increase wellbutrin. Patient would prefer to hold off at this point. Will prescribe xanax 0.5mg  #15 prn to get over next couple of weeks. Will follow up with patient in 2-3 months, sooner if needed. If still having difficulty, can increase wellbutrin. No SI/HI.

## 2021-02-18 NOTE — Progress Notes (Signed)
Medication follow up. Patient states she long term boyfriend recently broke up with her - so she is searching for a place for her and her kids to live.   Kathrene Alu RN

## 2021-04-22 ENCOUNTER — Encounter: Payer: Self-pay | Admitting: Family Medicine

## 2021-04-22 ENCOUNTER — Ambulatory Visit (INDEPENDENT_AMBULATORY_CARE_PROVIDER_SITE_OTHER): Payer: Managed Care, Other (non HMO) | Admitting: Family Medicine

## 2021-04-22 VITALS — BP 133/78 | HR 70 | Wt 167.0 lb

## 2021-04-22 DIAGNOSIS — F321 Major depressive disorder, single episode, moderate: Secondary | ICD-10-CM

## 2021-04-22 DIAGNOSIS — N951 Menopausal and female climacteric states: Secondary | ICD-10-CM | POA: Diagnosis not present

## 2021-04-22 MED ORDER — BUPROPION HCL ER (XL) 300 MG PO TB24: 300.0000 mg | ORAL_TABLET | Freq: Every day | ORAL | 3 refills | Status: DC

## 2021-04-22 NOTE — Progress Notes (Signed)
? ?  Subjective:  ? ? Patient ID: Susan Glover, female    DOB: 1965-07-01, 56 y.o.   MRN: [id number] ? ?HPI ? ?Patient seen for follow-up of depression.  Patient has been on Wellbutrin, which has been helpful for depressive symptoms.  She is still having difficulty falling asleep and wakes up a lot in the middle of the night.  She has diminished interest in doing things.  She does force herself to eat.  She has not used a lot of the Xanax medication.  She mainly only uses it if she is having a really hard time falling asleep ? ?Review of Systems ? ?   ?Objective:  ? Physical Exam ?Vitals reviewed.  ?Constitutional:   ?   Appearance: Normal appearance.  ?Cardiovascular:  ?   Rate and Rhythm: Normal rate and regular rhythm.  ?Skin: ?   Capillary Refill: Capillary refill takes less than 2 seconds.  ?Neurological:  ?   General: No focal deficit present.  ?   Mental Status: She is alert.  ?Psychiatric:     ?   Mood and Affect: Mood is depressed.     ?   Speech: Speech normal.     ?   Behavior: Behavior normal. Behavior is cooperative.     ?   Thought Content: Thought content normal.     ?   Cognition and Memory: Cognition normal.     ?   Judgment: Judgment normal.  ? ?   ?Assessment & Plan:  ?1. Vasomotor symptoms due to menopause ?2. Current moderate episode of major depressive disorder, unspecified whether recurrent ([place]) ?Increase wellbutrin to '300mg'$ . Discussed potential side effects. ? ?

## 2021-05-04 ENCOUNTER — Ambulatory Visit (HOSPITAL_BASED_OUTPATIENT_CLINIC_OR_DEPARTMENT_OTHER)
Admission: RE | Admit: 2021-05-04 | Discharge: 2021-05-04 | Disposition: A | Discharge status: Home / Self Care | Payer: Managed Care, Other (non HMO) | Source: Ambulatory Visit | Attending: Family | Admitting: Family

## 2021-05-04 ENCOUNTER — Ambulatory Visit (INDEPENDENT_AMBULATORY_CARE_PROVIDER_SITE_OTHER): Payer: Managed Care, Other (non HMO) | Admitting: Family

## 2021-05-04 ENCOUNTER — Other Ambulatory Visit: Payer: Self-pay | Admitting: Family

## 2021-05-04 VITALS — BP 123/70 | HR 77 | Temp 98.6°F | Resp 16 | Wt 167.0 lb

## 2021-05-04 DIAGNOSIS — R053 Chronic cough: Secondary | ICD-10-CM | POA: Diagnosis present

## 2021-05-04 DIAGNOSIS — E538 Deficiency of other specified B group vitamins: Secondary | ICD-10-CM | POA: Diagnosis not present

## 2021-05-04 DIAGNOSIS — F329 Major depressive disorder, single episode, unspecified: Secondary | ICD-10-CM | POA: Diagnosis not present

## 2021-05-04 LAB — VITAMIN B12: Vitamin B-12: 1172 pg/mL — ABNORMAL HIGH (ref 211–911)

## 2021-05-04 NOTE — Assessment & Plan Note (Signed)
Continue b12 injections. Check follow up level.  ?

## 2021-05-04 NOTE — Progress Notes (Signed)
? ?Subjective:  ? ?By signing my name below, I, Susan Glover, attest that this documentation has been prepared under the direction and in the presence of Susan Alar NP, 05/04/2021 ? ? Patient ID: Susan Glover, female    DOB: 07/09/1965, 56 y.o.   MRN: [id number] ? ?Chief Complaint  ?Patient presents with  ? Cough  ?  Complains of persistent dry cough  ? Depression  ?  Complains of increased depression, managed by OBGYN.   ? ? ?HPI ?Patient is in today for an office visit. ? ?Cough - She complains of a persistent cough that begun several weeks ago. She states that the cough has been happening all throughout the day. She denies the cough starting from a virus. She also denies a runny nose. She also states that she doesn't have a strong history of environmental allergies. She's have had reflux symptoms in the past but not as often as she used to. She took 40 MG of Protonix for her reflux but discontinued use once symptoms were relieved.  ? ?Stress/Depression - She has been experiencing stress/depression due to personal circumstances. She got a new position due to the stress of her previous position. She has been grieving a break up as of recently. Her live in boyfriend of 6 years recently broke up with her unexpectedly and she is still living in the same home with her children while she tries to look for a new house. She has been taking 300 MG of Wellbutrin XL. She was initially feeling better while taking the medication but since the breakup, she has been feeling immense stress and sadness. ? ? ?Health Maintenance Due  ?Topic Date Due  ? HIV Screening  Never done  ? Hepatitis C Screening  Never done  ? TETANUS/TDAP  Never done  ? Zoster Vaccines- Shingrix (1 of 2) Never done  ? COVID-19 Vaccine (3 - Booster for [employer] series) 07/09/2019  ? ? ?Past Medical History:  ?Diagnosis Date  ? Anxiety   ? hx of  ? B12 deficiency 10/29/2019  ? Chicken pox   ? Depression   ? hx of  ? Migraines   ? Shingles 08/06/2020   ? ? ?Past Surgical History:  ?Procedure Laterality Date  ? APPENDECTOMY  1990  ? BACK SURGERY    ? FOOT FRACTURE SURGERY Bilateral 2019  ? WISDOM TOOTH EXTRACTION    ? ? ?Family History  ?Problem Relation Age of Onset  ? Asthma Mother   ? Arthritis Mother   ? Depression Mother   ? Colon polyps Mother 12  ? Alcohol abuse Father   ? Heart disease Father   ?     MI, brain aneurysm  ? Hyperlipidemia Father   ? COPD Father   ? Arthritis Sister   ? Depression Sister   ? Colon polyps Sister 71  ? Breast cancer Maternal Aunt   ? Arthritis Maternal Grandmother   ? Asthma Maternal Grandmother   ? COPD Maternal Grandmother   ? Alcohol abuse Maternal Grandfather   ? Asthma Maternal Grandfather   ? COPD Maternal Grandfather   ? Drug abuse Maternal Grandfather   ? Hyperlipidemia Maternal Grandfather   ? Hypertension Maternal Grandfather   ? Asthma Paternal Grandmother   ? COPD Paternal Grandmother   ? Heart attack Paternal Grandfather   ? Hyperlipidemia Paternal Grandfather   ? Hypertension Paternal Grandfather   ? Alcohol abuse Brother   ? Asthma Son   ? Depression Son   ?  Asthma Son   ? Depression Son   ? Colon cancer Neg Hx   ? Esophageal cancer Neg Hx   ? Rectal cancer Neg Hx   ? Stomach cancer Neg Hx   ? ? ?Social History  ? ?Socioeconomic History  ? Marital status: Divorced  ?  Spouse name: Not on file  ? Number of children: Not on file  ? Years of education: Not on file  ? Highest education level: Not on file  ?Occupational History  ? Not on file  ?Tobacco Use  ? Smoking status: Former  ?  Types: Cigarettes  ?  Start date: 01/27/1985  ?  Quit date: 01/28/2012  ?  Years since quitting: 9.2  ? Smokeless tobacco: Never  ?Vaping Use  ? Vaping Use: Never used  ?Substance and Sexual Activity  ? Alcohol use: Yes  ?  Alcohol/week: 2.0 standard drinks  ?  Types: 2 Standard drinks or equivalent per week  ?  Comment: 2-3 drinks per week or less  ? Drug use: Never  ? Sexual activity: Yes  ?  Partners: Male  ?Other Topics Concern  ? Not  on file  ?Social History Narrative  ? Right handed  ? 1 cup coffee/day  ? 1-2 sodas daily or less  ? 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 Has been married and divorced twice, lives with [name]  ? 2 sons  ? 2004- Will  ? 2007[name]  ? Completed some college  ? Enjoys gardening, games, reading  ? 2 cats and 2 dogs, 2 crabs  ? ?Social Determinants of Health  ? ?Financial Resource Strain: Not on file  ?Food Insecurity: Not on file  ?Transportation Needs: Not on file  ?Physical Activity: Not on file  ?Stress: Not on file  ?Social Connections: Not on file  ?Intimate Partner Violence: Not on file  ? ? ?Outpatient Medications Prior to Visit  ?Medication Sig Dispense Refill  ? buPROPion (WELLBUTRIN XL) 300 MG 24 hr tablet Take 1 tablet (300 mg total) by mouth daily. 90 tablet 3  ? cyanocobalamin (,VITAMIN B-12,) 1000 MCG/ML injection Inject 1 mL (1,000 mcg total) into the muscle once a week. 12 mL 3  ? estradiol-norethindrone (ACTIVELLA) 1-0.5 MG tablet Take 1 tablet by mouth daily. 90 tablet 3  ? ibuprofen (ADVIL) 800 MG tablet Take 800 mg by mouth every 8 (eight) hours as needed.    ? SUMAtriptan (IMITREX) 50 MG tablet Take 1 tab by mouth at start of migraine- may repeat in 2 hrs as needed. 10 tablet 5  ? Syringe/Needle, Disp, (SYRINGE 3CC/25GX1-1/2") 25G X 1-1/2" 3 ML MISC Use as directed 25 each 0  ? ALPRAZolam (XANAX) 0.5 MG tablet Take 1 tablet (0.5 mg total) by mouth at bedtime as needed for anxiety. (Patient not taking: Reported on 05/04/2021) 15 tablet 0  ? pantoprazole (PROTONIX) 40 MG tablet TAKE 1 TABLET BY MOUTH EVERY DAY (Patient not taking: Reported on 05/04/2021) 90 tablet 1  ? ?No facility-administered medications prior to visit.  ? ? ?Allergies  ?Allergen Reactions  ? Poison Ivy Extract Dermatitis, Itching and Rash  ? ? ?Review of Systems  ?Respiratory:  Positive for cough.   ?Psychiatric/Behavioral:  Positive for depression.   ? ?   ?Objective:  ?  ?Physical Exam ?Constitutional:   ?   General: She is  not in acute distress. ?   Appearance: Normal appearance. She is not ill-appearing.  ?HENT:  ?   Head: Normocephalic and atraumatic.  ?  Right Ear: External ear normal.  ?   Left Ear: External ear normal.  ?Eyes:  ?   Extraocular Movements: Extraocular movements intact.  ?   Pupils: Pupils are equal, round, and reactive to light.  ?Cardiovascular:  ?   Rate and Rhythm: Normal rate and regular rhythm.  ?   Heart sounds: Normal heart sounds. No murmur heard. ?  No gallop.  ?Pulmonary:  ?   Effort: Pulmonary effort is normal. No respiratory distress.  ?   Breath sounds: Normal breath sounds. No wheezing or rales.  ?Skin: ?   General: Skin is warm and dry.  ?Neurological:  ?   Mental Status: She is alert and oriented to person, place, and time.  ?Psychiatric:     ?   Mood and Affect: Mood normal.     ?   Behavior: Behavior normal.     ?   Judgment: Judgment normal.  ? ? ?BP 123/70 (BP Location: Right Arm, Patient Position: Sitting, Cuff Size: Large)   Pulse 77   Temp 98.6 ?F (37 ?C) (Oral)   Resp 16   Wt 167 lb (75.8 kg)   LMP 07/24/2020   SpO2 99%   BMI 25.92 kg/m?  ?Wt Readings from Last 3 Encounters:  ?05/04/21 167 lb (75.8 kg)  ?04/22/21 167 lb (75.8 kg)  ?02/18/21 173 lb (78.5 kg)  ? ? ?   ?Assessment & Plan:  ? ?Problem List Items Addressed This Visit   ? ?  ? Unprioritized  ? Reactive depression (situational)  ?  Wellbutrin was recently increased. Will give that some more time before considering additional medication adjustments.  I did recommend that she get established with a counselor. Support provided.  ? ?  ?  ? Persistent dry cough - Primary  ?  New. ? Reflux.  Will retrial protonix. Check cxr.  ? ?  ?  ? Relevant Orders  ? DG Chest 2 View  ? B12 deficiency  ?  Continue b12 injections. Check follow up level.  ? ?  ?  ? Relevant Orders  ? B12  ? ? ?40 minutes spent on today's visit. Time was spent counseling patient on depression/anxiety and evaluating patient's cough.  ? ?No orders of the defined  types were placed in this encounter. ? ? ?I, Nance Pear, NP, personally preformed the services described in this documentation.  All medical record entries made by the scribe were at my direction and

## 2021-05-04 NOTE — Assessment & Plan Note (Signed)
New. ? Reflux.  Will retrial protonix. Check cxr.  ?

## 2021-05-04 NOTE — Patient Instructions (Addendum)
Please complete x-ray on the first floor.  ?Restart protonix.  ?Call [facility] to schedule an appointment with a counselor.  ?Complete lab work prior to leaving.  ?

## 2021-05-04 NOTE — Assessment & Plan Note (Signed)
Wellbutrin was recently increased. Will give that some more time before considering additional medication adjustments.  I did recommend that she get established with a counselor. Support provided.  ?

## 2021-05-05 ENCOUNTER — Encounter: Payer: Self-pay | Admitting: Family

## 2021-06-02 ENCOUNTER — Ambulatory Visit: Payer: Managed Care, Other (non HMO) | Admitting: Family

## 2021-06-04 ENCOUNTER — Ambulatory Visit: Payer: Managed Care, Other (non HMO) | Admitting: Family

## 2021-06-07 ENCOUNTER — Encounter: Payer: Self-pay | Admitting: Family

## 2021-06-07 ENCOUNTER — Ambulatory Visit (INDEPENDENT_AMBULATORY_CARE_PROVIDER_SITE_OTHER): Payer: Managed Care, Other (non HMO) | Admitting: Family

## 2021-06-07 VITALS — BP 124/85 | HR 81 | Temp 98.4°F | Resp 16 | Wt 163.0 lb

## 2021-06-07 DIAGNOSIS — R053 Chronic cough: Secondary | ICD-10-CM

## 2021-06-07 DIAGNOSIS — R110 Nausea: Secondary | ICD-10-CM | POA: Diagnosis not present

## 2021-06-07 DIAGNOSIS — E538 Deficiency of other specified B group vitamins: Secondary | ICD-10-CM | POA: Diagnosis not present

## 2021-06-07 DIAGNOSIS — F329 Major depressive disorder, single episode, unspecified: Secondary | ICD-10-CM

## 2021-06-07 DIAGNOSIS — K219 Gastro-esophageal reflux disease without esophagitis: Secondary | ICD-10-CM | POA: Diagnosis not present

## 2021-06-07 LAB — VITAMIN B12: Vitamin B-12: 193 pg/mL — ABNORMAL LOW (ref 211–911)

## 2021-06-07 NOTE — Addendum Note (Signed)
Addended by: Jiles Prows on: 06/07/2021 04:07 PM   Modules accepted: Orders

## 2021-06-07 NOTE — Assessment & Plan Note (Signed)
Resolved. Monitor.  

## 2021-06-07 NOTE — Assessment & Plan Note (Addendum)
She will soon be closing on her new home and moving with her children out of her ex-boyfriend's home. I think this will be a positive forward move for her towards healing. She is still working on getting set up with a therapist.

## 2021-06-07 NOTE — Assessment & Plan Note (Signed)
She continues home b12 injections twice weekly. Will check a follow up level.

## 2021-06-07 NOTE — Progress Notes (Signed)
Subjective:   By signing my name below, I, Susan Glover, attest that this documentation has been prepared under the direction and in the presence of Susan Alar, NP 06/07/2021     Patient ID: Susan Glover, female    DOB: 1965-03-28, 56 y.o.   MRN: [id number]  Chief Complaint  Patient presents with   B12 deficiency    Here for follow up   Depression    "Having a hard time"    HPI Patient is in today for a follow-up visit.  Depression- She is still feeling sad and is waiting for things to settle down at the end of June when she moves. The dosage of Wellbutrin was increased to 300 mg. She tried to start counseling but they are backed-up.   Cough- She reports the cough is resolved but she has been feeling nauseated recently. She thinks it is related to her eating. She restarted 40 mg Protonix.   B12- She reports she has missed the last 2 appointments for b12 check. She is starting to feel tingly. She does the injections herself and last dose was Monday.   Past Medical History:  Diagnosis Date   Anxiety    hx of   B12 deficiency 10/29/2019   Chicken pox    Depression    hx of   Herpes zoster without complication 05/25/5636   Migraines    Shingles 08/06/2020    Past Surgical History:  Procedure Laterality Date   APPENDECTOMY  1990   BACK SURGERY     FOOT FRACTURE SURGERY Bilateral 2019   WISDOM TOOTH EXTRACTION      Family History  Problem Relation Age of Onset   Asthma Mother    Arthritis Mother    Depression Mother    Colon polyps Mother 74   Alcohol abuse Father    Heart disease Father        MI, brain aneurysm   Hyperlipidemia Father    COPD Father    Arthritis Sister    Depression Sister    Colon polyps Sister 2   Breast cancer Maternal Aunt    Arthritis Maternal Grandmother    Asthma Maternal Grandmother    COPD Maternal Grandmother    Alcohol abuse Maternal Grandfather    Asthma Maternal Grandfather    COPD Maternal Grandfather    Drug  abuse Maternal Grandfather    Hyperlipidemia Maternal Grandfather    Hypertension Maternal Grandfather    Asthma Paternal Grandmother    COPD Paternal Grandmother    Heart attack Paternal Grandfather    Hyperlipidemia Paternal Grandfather    Hypertension Paternal Grandfather    Alcohol abuse Brother    Asthma Son    Depression Son    Asthma Son    Depression Son    Colon cancer Neg Hx    Esophageal cancer Neg Hx    Rectal cancer Neg Hx    Stomach cancer Neg Hx     Social History   Socioeconomic History   Marital status: Divorced    Spouse name: Not on file   Number of children: Not on file   Years of education: Not on file   Highest education level: Not on file  Occupational History   Not on file  Tobacco Use   Smoking status: Former    Types: Cigarettes    Start date: 01/27/1985    Quit date: 01/28/2012    Years since quitting: 9.3   Smokeless tobacco: Never  Vaping Use  Vaping Use: Never used  Substance and Sexual Activity   Alcohol use: Yes    Alcohol/week: 2.0 standard drinks    Types: 2 Standard drinks or equivalent per week    Comment: 2-3 drinks per week or less   Drug use: Never   Sexual activity: Yes    Partners: Male  Other Topics Concern   Not on file  Social History Narrative   Right handed   1 cup coffee/day   1-2 sodas daily or less   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)   Has been married and divorced twice, lives with Susan Glover   2 sons   2004- Will   2007- Susan Glover   Completed some college   Enjoys gardening, games, reading   2 cats and 2 dogs, 2 crabs   Social Determinants of Radio broadcast assistant Strain: Not on file  Food Insecurity: Not on file  Transportation Needs: Not on file  Physical Activity: Not on file  Stress: Not on file  Social Connections: Not on file  Intimate Partner Violence: Not on file    Outpatient Medications Prior to Visit  Medication Sig Dispense Refill   ALPRAZolam (XANAX) 0.5 MG tablet Take 1 tablet  (0.5 mg total) by mouth at bedtime as needed for anxiety. 15 tablet 0   buPROPion (WELLBUTRIN XL) 300 MG 24 hr tablet Take 1 tablet (300 mg total) by mouth daily. 90 tablet 3   cyanocobalamin (,VITAMIN B-12,) 1000 MCG/ML injection Inject 1 mL (1,000 mcg total) into the muscle once a week. 12 mL 3   estradiol-norethindrone (ACTIVELLA) 1-0.5 MG tablet Take 1 tablet by mouth daily. 90 tablet 3   ibuprofen (ADVIL) 800 MG tablet Take 800 mg by mouth every 8 (eight) hours as needed.     pantoprazole (PROTONIX) 40 MG tablet TAKE 1 TABLET BY MOUTH EVERY DAY 90 tablet 1   SUMAtriptan (IMITREX) 50 MG tablet Take 1 tab by mouth at start of migraine- may repeat in 2 hrs as needed. 10 tablet 5   Syringe/Needle, Disp, (SYRINGE 3CC/25GX1-1/2") 25G X 1-1/2" 3 ML MISC Use as directed 25 each 0   No facility-administered medications prior to visit.    Allergies  Allergen Reactions   Poison Ivy Extract Dermatitis, Itching and Rash    Review of Systems  Respiratory:  Negative for cough.   Cardiovascular:  Negative for chest pain.  Gastrointestinal:  Positive for nausea.  Genitourinary:  Negative for dysuria.  Neurological:  Negative for headaches.  Psychiatric/Behavioral:  Positive for depression.       Objective:    Physical Exam Constitutional:      General: She is not in acute distress.    Appearance: Normal appearance. She is not ill-appearing.  HENT:     Head: Normocephalic and atraumatic.     Right Ear: External ear normal.     Left Ear: External ear normal.  Cardiovascular:     Rate and Rhythm: Normal rate and regular rhythm.     Pulses: Normal pulses.     Heart sounds: Normal heart sounds. No murmur heard. Pulmonary:     Effort: Pulmonary effort is normal. No respiratory distress.     Breath sounds: Normal breath sounds. No wheezing or rhonchi.  Skin:    General: Skin is warm and dry.  Neurological:     Mental Status: She is alert and oriented to person, place, and time.   Psychiatric:        Mood and Affect: Affect is  tearful.        Behavior: Behavior normal.        Judgment: Judgment normal.    BP 124/85   Pulse 81   Temp 98.4 F (36.9 C) (Oral)   Resp 16   Wt 163 lb (73.9 kg)   LMP 07/24/2020   SpO2 100%   BMI 25.30 kg/m  Wt Readings from Last 3 Encounters:  06/07/21 163 lb (73.9 kg)  05/04/21 167 lb (75.8 kg)  04/22/21 167 lb (75.8 kg)       Assessment & Plan:   Problem List Items Addressed This Visit       Unprioritized   Reactive depression (situational)    She will soon be closing on her new home and moving with her children out of her ex-boyfriend's home. I think this will be a positive forward move for her towards healing. She is still working on getting set up with a therapist.        Persistent dry cough    Resolved. Monitor.        Gastroesophageal reflux disease    Continues protonix.        Chronic nausea    New. Continue protonix. Recommended abdominal US to further evaluation GB. She wishes to postpone        B12 deficiency - Primary    She continues home b12 injections twice weekly. Will check a follow up level.        Relevant Orders   B12 (Completed)    No orders of the defined types were placed in this encounter.   I,Susan Glover,acting as a Education administrator for [employer], NP.,have documented all relevant documentation on the behalf of Nance Pear, NP,as directed by  Nance Pear, NP while in the presence of Nance Pear, NP.   Cordella Register, NP 06/07/2021 , personally preformed the services described in this documentation.  All medical record entries made by the scribe were at my direction and in my presence.  I have reviewed the chart and discharge instructions (if applicable) and agree that the record reflects my personal performance and is accurate and complete. 06/07/2021

## 2021-06-07 NOTE — Assessment & Plan Note (Signed)
New. Continue protonix. Recommended abdominal US to further evaluation GB. She wishes to postpone

## 2021-06-07 NOTE — Assessment & Plan Note (Signed)
Continues protonix. 

## 2021-07-22 ENCOUNTER — Other Ambulatory Visit (INDEPENDENT_AMBULATORY_CARE_PROVIDER_SITE_OTHER): Payer: Managed Care, Other (non HMO)

## 2021-07-22 ENCOUNTER — Encounter: Payer: Self-pay | Admitting: Family Medicine

## 2021-07-22 ENCOUNTER — Ambulatory Visit (INDEPENDENT_AMBULATORY_CARE_PROVIDER_SITE_OTHER): Payer: Managed Care, Other (non HMO) | Admitting: Family Medicine

## 2021-07-22 VITALS — BP 134/87 | HR 77 | Wt 167.0 lb

## 2021-07-22 DIAGNOSIS — E538 Deficiency of other specified B group vitamins: Secondary | ICD-10-CM | POA: Diagnosis not present

## 2021-07-22 DIAGNOSIS — F329 Major depressive disorder, single episode, unspecified: Secondary | ICD-10-CM

## 2021-07-22 LAB — VITAMIN B12: Vitamin B-12: 441 pg/mL (ref 211–911)

## 2021-07-22 NOTE — Progress Notes (Signed)
Patient reports she is doing well. Kathrene Alu RN

## 2021-07-22 NOTE — Progress Notes (Signed)
   Subjective:    Patient ID: Raymon Mutton, female    DOB: 05-31-1965, 56 y.o.   MRN: [id number]  HPI  Patient seen from impression.  Patient has been Wellbutrin 300 mg daily.  She has been doing well on that dose.  Her social situation has improved: Patient has her own house and things have been going well at work. Reports no side effects to the medication.  Review of Systems     Objective:   Physical Exam Vitals reviewed.  Constitutional:      Appearance: Normal appearance.  Cardiovascular:     Rate and Rhythm: Normal rate and regular rhythm.     Pulses: Normal pulses.  Pulmonary:     Effort: Pulmonary effort is normal.     Breath sounds: Normal breath sounds.  Skin:    Capillary Refill: Capillary refill takes less than 2 seconds.  Neurological:     General: No focal deficit present.     Mental Status: She is alert.  Psychiatric:        Mood and Affect: Mood normal.        Behavior: Behavior normal.        Thought Content: Thought content normal.           Assessment & Plan:  1. Reactive depression (situational) Continue with Wellbutrin XR '300mg'$  daily for now. The patient may choose to taper down in a couple of months. She will contact me if she desires to do this. Follow up in 6 months for annual exam.

## 2021-07-23 ENCOUNTER — Telehealth: Payer: Self-pay | Admitting: Family

## 2021-07-23 NOTE — Telephone Encounter (Signed)
Patient advised of results, she is doing B12 shots once a week. Patient advised to continue the same

## 2021-07-23 NOTE — Telephone Encounter (Signed)
Please contact pt and let her know that b12 level looks good.  Has she been taking b12 shots twice a week? She should continue whatever dose she has been taking.

## 2021-09-08 ENCOUNTER — Telehealth: Payer: Self-pay | Admitting: Family

## 2021-09-08 ENCOUNTER — Other Ambulatory Visit: Payer: Self-pay

## 2021-09-08 ENCOUNTER — Ambulatory Visit: Payer: Managed Care, Other (non HMO) | Admitting: Family

## 2021-09-08 VITALS — BP 119/82 | HR 70 | Temp 98.2°F | Resp 18 | Ht 67.3 in | Wt 159.4 lb

## 2021-09-08 DIAGNOSIS — E538 Deficiency of other specified B group vitamins: Secondary | ICD-10-CM

## 2021-09-08 DIAGNOSIS — R053 Chronic cough: Secondary | ICD-10-CM

## 2021-09-08 DIAGNOSIS — K219 Gastro-esophageal reflux disease without esophagitis: Secondary | ICD-10-CM | POA: Diagnosis not present

## 2021-09-08 DIAGNOSIS — F329 Major depressive disorder, single episode, unspecified: Secondary | ICD-10-CM | POA: Diagnosis not present

## 2021-09-08 LAB — VITAMIN B12: Vitamin B-12: 319 pg/mL (ref 211–911)

## 2021-09-08 MED ORDER — CYANOCOBALAMIN 1000 MCG/ML IJ SOLN
1000.0000 ug | INTRAMUSCULAR | 3 refills | Status: DC
Start: 2021-09-09 — End: 2022-02-28

## 2021-09-08 NOTE — Assessment & Plan Note (Signed)
Resolved following addition of pantoprazole. Continue same.

## 2021-09-08 NOTE — Progress Notes (Signed)
Subjective:     Patient ID: Susan Glover, female    DOB: [birth year], 56 y.o.   MRN: [id number]  Chief Complaint  Patient presents with   Follow-up    3 month    HPI Patient is in today for follow up.   Depression-  she is exercising more.  She is in her new home.   Wt Readings from Last 3 Encounters:  09/08/21 159 lb 6.4 oz (72.3 kg)  07/22/21 167 lb (75.8 kg)  06/07/21 163 lb (73.9 kg)   Denies any further nausea or dry cough.  Continues pantoprazole.  B12 injections- once weekly at home. She has some tingling in her left foot, mild in right, fingers no longer tingling.    Health Maintenance Due  Topic Date Due   HIV Screening  Never done   Hepatitis C Screening  Never done   TETANUS/TDAP  Never done   Zoster Vaccines- Shingrix (1 of 2) Never done   COVID-19 Vaccine (3 - Pfizer series) 07/09/2019   INFLUENZA VACCINE  08/17/2021    Past Medical History:  Diagnosis Date   Anxiety    hx of   B12 deficiency 10/29/2019   Chicken pox    Depression    hx of   Herpes zoster without complication 03/25/2503   Migraines    Shingles 08/06/2020    Past Surgical History:  Procedure Laterality Date   APPENDECTOMY  1990   BACK SURGERY     FOOT FRACTURE SURGERY Bilateral 2019   WISDOM TOOTH EXTRACTION      Family History  Problem Relation Age of Onset   Asthma Mother    Arthritis Mother    Depression Mother    Colon polyps Mother 61   Alcohol abuse Father    Heart disease Father        MI, brain aneurysm   Hyperlipidemia Father    COPD Father    Arthritis Sister    Depression Sister    Colon polyps Sister 7   Breast cancer Maternal Aunt    Arthritis Maternal Grandmother    Asthma Maternal Grandmother    COPD Maternal Grandmother    Alcohol abuse Maternal Grandfather    Asthma Maternal Grandfather    COPD Maternal Grandfather    Drug abuse Maternal Grandfather    Hyperlipidemia Maternal Grandfather    Hypertension Maternal Grandfather    Asthma  Paternal Grandmother    COPD Paternal Grandmother    Heart attack Paternal Grandfather    Hyperlipidemia Paternal Grandfather    Hypertension Paternal Grandfather    Alcohol abuse Brother    Asthma Son    Depression Son    Asthma Son    Depression Son    Colon cancer Neg Hx    Esophageal cancer Neg Hx    Rectal cancer Neg Hx    Stomach cancer Neg Hx     Social History   Socioeconomic History   Marital status: Divorced    Spouse name: Not on file   Number of children: Not on file   Years of education: Not on file   Highest education level: Not on file  Occupational History   Not on file  Tobacco Use   Smoking status: Former    Types: Cigarettes    Start date: 01/27/1985    Quit date: 01/28/2012    Years since quitting: 9.6   Smokeless tobacco: Never  Vaping Use   Vaping Use: Never used  Substance and Sexual Activity  Alcohol use: Yes    Alcohol/week: 2.0 standard drinks of alcohol    Types: 2 Standard drinks or equivalent per week    Comment: 2-3 drinks per week or less   Drug use: Never   Sexual activity: Yes    Partners: Male  Other Topics Concern   Not on file  Social History Narrative   Right handed   1 cup coffee/day   1-2 sodas daily or less   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)   Has been married and divorced twice, lives with [name]   2 sons   2004- Will   2007- [name]   Completed some college   Enjoys gardening, games, reading   2 cats and 2 dogs, 2 crabs   Social Determinants of Radio broadcast assistant Strain: Not on file  Food Insecurity: Not on file  Transportation Needs: Not on file  Physical Activity: Not on file  Stress: Not on file  Social Connections: Not on file  Intimate Partner Violence: Not on file    Outpatient Medications Prior to Visit  Medication Sig Dispense Refill   buPROPion (WELLBUTRIN XL) 300 MG 24 hr tablet Take 1 tablet (300 mg total) by mouth daily. 90 tablet 3   cyanocobalamin (,VITAMIN B-12,) 1000 MCG/ML  injection Inject 1 mL (1,000 mcg total) into the muscle once a week. 12 mL 3   estradiol-norethindrone (ACTIVELLA) 1-0.5 MG tablet Take 1 tablet by mouth daily. 90 tablet 3   ibuprofen (ADVIL) 800 MG tablet Take 800 mg by mouth every 8 (eight) hours as needed.     pantoprazole (PROTONIX) 40 MG tablet TAKE 1 TABLET BY MOUTH EVERY DAY 90 tablet 1   SUMAtriptan (IMITREX) 50 MG tablet Take 1 tab by mouth at start of migraine- may repeat in 2 hrs as needed. 10 tablet 5   Syringe/Needle, Disp, (SYRINGE 3CC/25GX1-1/2") 25G X 1-1/2" 3 ML MISC Use as directed 25 each 0   No facility-administered medications prior to visit.    Allergies  Allergen Reactions   Poison Ivy Extract Dermatitis, Itching and Rash    ROS See HPI    Objective:    Physical Exam Constitutional:      General: She is not in acute distress.    Appearance: Normal appearance. She is well-developed.  HENT:     Head: Normocephalic and atraumatic.     Right Ear: External ear normal.     Left Ear: External ear normal.  Eyes:     General: No scleral icterus. Neck:     Thyroid: No thyromegaly.  Cardiovascular:     Rate and Rhythm: Normal rate and regular rhythm.     Heart sounds: Normal heart sounds. No murmur heard. Pulmonary:     Effort: Pulmonary effort is normal. No respiratory distress.     Breath sounds: Normal breath sounds. No wheezing.  Musculoskeletal:     Cervical back: Neck supple.  Skin:    General: Skin is warm and dry.  Neurological:     Mental Status: She is alert and oriented to person, place, and time.  Psychiatric:        Mood and Affect: Mood normal.        Behavior: Behavior normal.        Thought Content: Thought content normal.        Judgment: Judgment normal.     BP 119/82   Pulse 70   Temp 98.2 F (36.8 C)   Resp 18   Ht 5'  7.3" (1.709 m)   Wt 159 lb 6.4 oz (72.3 kg)   LMP 07/24/2020   SpO2 100%   BMI 24.74 kg/m  Wt Readings from Last 3 Encounters:  09/08/21 159 lb 6.4 oz  (72.3 kg)  07/22/21 167 lb (75.8 kg)  06/07/21 163 lb (73.9 kg)       Assessment & Plan:   Problem List Items Addressed This Visit       Unprioritized   Reactive depression (situational)    She is doing better overall. Dog died yesterday which is a setback. She is in her new home. She continues wellbutrin xl '300mg'$  once daily. She hopes to start to wean back and off of wellbutrin in a few months and will let me know when she is ready.       Persistent dry cough    Resolved following addition of pantoprazole. Continue same.       Gastroesophageal reflux disease    Stable on pantoprazole. Continue same. Nausea is now resolved.       B12 deficiency - Primary    Will recheck level. Continue weekly b12 injections.      Relevant Orders   B12    I am having Londynn C. Trenton Gammon "Juliann Pulse" maintain her SUMAtriptan, SYRINGE 3CC/25GX1-1/2", estradiol-norethindrone, cyanocobalamin, ibuprofen, buPROPion, and pantoprazole.  No orders of the defined types were placed in this encounter.

## 2021-09-08 NOTE — Telephone Encounter (Signed)
B12 still on the low end of normal. Let's have her increase her b12 injections from once weekly to twice weekly.  Repeat b12 in 1 month, dx b12 deficiency. I will send a refill now.

## 2021-09-08 NOTE — Assessment & Plan Note (Signed)
Will recheck level. Continue weekly b12 injections.

## 2021-09-08 NOTE — Assessment & Plan Note (Signed)
She is doing better overall. Dog died yesterday which is a setback. She is in her new home. She continues wellbutrin xl '300mg'$  once daily. She hopes to start to wean back and off of wellbutrin in a few months and will let me know when she is ready.

## 2021-09-08 NOTE — Telephone Encounter (Signed)
Patient advised of results and provider's recommendations. She was scheduled to have b12 check appt. On 10-05-21

## 2021-09-08 NOTE — Assessment & Plan Note (Signed)
Stable on pantoprazole. Continue same. Nausea is now resolved.

## 2021-10-05 ENCOUNTER — Other Ambulatory Visit (INDEPENDENT_AMBULATORY_CARE_PROVIDER_SITE_OTHER): Payer: Managed Care, Other (non HMO)

## 2021-10-05 DIAGNOSIS — E538 Deficiency of other specified B group vitamins: Secondary | ICD-10-CM

## 2021-10-05 LAB — VITAMIN B12: Vitamin B-12: 443 pg/mL (ref 211–911)

## 2021-10-14 ENCOUNTER — Other Ambulatory Visit: Payer: Self-pay | Admitting: Family Medicine

## 2021-10-14 DIAGNOSIS — N951 Menopausal and female climacteric states: Secondary | ICD-10-CM

## 2021-10-26 ENCOUNTER — Telehealth: Payer: Self-pay | Admitting: General Practice

## 2021-10-26 NOTE — Telephone Encounter (Signed)
Left message on VM for pt to contact our office to schedule 3 month follow up in December with Dr. Nehemiah Settle.

## 2021-10-29 ENCOUNTER — Other Ambulatory Visit: Payer: Self-pay | Admitting: Family

## 2021-11-03 MED ORDER — ESTRADIOL-NORETHINDRONE ACET 1-0.5 MG PO TABS: 1.0000 | ORAL_TABLET | Freq: Every day | ORAL | 3 refills | Status: DC

## 2021-11-03 NOTE — Addendum Note (Signed)
Addended by: Truett Mainland on: 11/03/2021 11:23 AM   Modules accepted: Orders

## 2021-11-08 ENCOUNTER — Other Ambulatory Visit: Payer: Self-pay | Admitting: Family Medicine

## 2021-11-08 DIAGNOSIS — N951 Menopausal and female climacteric states: Secondary | ICD-10-CM

## 2021-11-11 ENCOUNTER — Other Ambulatory Visit: Payer: Self-pay

## 2021-11-11 ENCOUNTER — Encounter: Payer: Self-pay | Admitting: Family Medicine

## 2021-11-11 DIAGNOSIS — N951 Menopausal and female climacteric states: Secondary | ICD-10-CM

## 2021-11-11 MED ORDER — ESTRADIOL-NORETHINDRONE ACET 1-0.5 MG PO TABS: 1.0000 | ORAL_TABLET | Freq: Every day | ORAL | 3 refills | Status: DC

## 2021-11-11 NOTE — Progress Notes (Unsigned)
Patient requested refill via my chart. Kathrene Alu RN

## 2021-12-07 ENCOUNTER — Ambulatory Visit: Payer: Managed Care, Other (non HMO) | Admitting: Family

## 2021-12-07 VITALS — BP 137/95 | HR 72 | Temp 98.2°F | Resp 16 | Wt 169.0 lb

## 2021-12-07 DIAGNOSIS — K219 Gastro-esophageal reflux disease without esophagitis: Secondary | ICD-10-CM | POA: Diagnosis not present

## 2021-12-07 DIAGNOSIS — Z23 Encounter for immunization: Secondary | ICD-10-CM

## 2021-12-07 DIAGNOSIS — F329 Major depressive disorder, single episode, unspecified: Secondary | ICD-10-CM | POA: Diagnosis not present

## 2021-12-07 DIAGNOSIS — E538 Deficiency of other specified B group vitamins: Secondary | ICD-10-CM

## 2021-12-07 DIAGNOSIS — B351 Tinea unguium: Secondary | ICD-10-CM

## 2021-12-07 LAB — VITAMIN B12: Vitamin B-12: 430 pg/mL (ref 211–911)

## 2021-12-07 NOTE — Progress Notes (Signed)
Subjective:   By signing my name below, I, [name], attest that this documentation has been prepared under the direction and in the presence of Karie Chimera, NP 12/07/2021   Patient ID: Susan Glover, female    DOB: 08-13-1965, 56 y.o.   MRN: [id number]  Chief Complaint  Patient presents with   Depression    Doing well on bupropion    HPI Patient is in today for an office visit  Vitamin B-12: She is currently receiving Vitamin B-12 at-home injections twice a week.   Shiny Right Big Toenail: She complains of a shiny right big toenail.   Mood: She has recently moved into her new home and is currently taking 300 mg of Wellbutrin XL. She states that her mood is slightly improving but not stable. Her step-dad was recently diagnosed with lung cancer.   Acid Reflux: She reports that symptoms are controlled. She is currently taking 40 mg of Protonix  Migraines: She reports a migraine on 12/03/2021 which lasted for about a day and a half. She hadn't received a migraine for a while prior to this most recent one  Broken Right Pinky: She had went to an urgent care for her symptoms and received a stent. She was recommended to see a specialist but is afraid to.   Immunizations: She is interested in receiving the influenza and Shingles vaccine during today's visit. She is due for an updated Covid vaccine  Health Maintenance Due  Topic Date Due   HIV Screening  Never done   Hepatitis C Screening  Never done   Zoster Vaccines- Shingrix (1 of 2) Never done   INFLUENZA VACCINE  08/17/2021   COVID-19 Vaccine (4 - 2023-24 season) 09/17/2021    Past Medical History:  Diagnosis Date   Anxiety    hx of   B12 deficiency 10/29/2019   Chicken pox    Depression    hx of   Herpes zoster without complication 0/02/6376   Migraines    Shingles 08/06/2020    Past Surgical History:  Procedure Laterality Date   APPENDECTOMY  1990   BACK SURGERY     FOOT FRACTURE SURGERY Bilateral  2019   WISDOM TOOTH EXTRACTION      Family History  Problem Relation Age of Onset   Asthma Mother    Arthritis Mother    Depression Mother    Colon polyps Mother 51   Alcohol abuse Father    Heart disease Father        MI, brain aneurysm   Hyperlipidemia Father    COPD Father    Arthritis Sister    Depression Sister    Colon polyps Sister 59   Breast cancer Maternal Aunt    Arthritis Maternal Grandmother    Asthma Maternal Grandmother    COPD Maternal Grandmother    Alcohol abuse Maternal Grandfather    Asthma Maternal Grandfather    COPD Maternal Grandfather    Drug abuse Maternal Grandfather    Hyperlipidemia Maternal Grandfather    Hypertension Maternal Grandfather    Asthma Paternal Grandmother    COPD Paternal Grandmother    Heart attack Paternal Grandfather    Hyperlipidemia Paternal Grandfather    Hypertension Paternal Grandfather    Alcohol abuse Brother    Asthma Son    Depression Son    Asthma Son    Depression Son    Colon cancer Neg Hx    Esophageal cancer Neg Hx    Rectal cancer  Neg Hx    Stomach cancer Neg Hx     Social History   Socioeconomic History   Marital status: Divorced    Spouse name: Not on file   Number of children: Not on file   Years of education: Not on file   Highest education level: Not on file  Occupational History   Not on file  Tobacco Use   Smoking status: Former    Types: Cigarettes    Start date: 01/27/1985    Quit date: 01/28/2012    Years since quitting: 9.8   Smokeless tobacco: Never  Vaping Use   Vaping Use: Never used  Substance and Sexual Activity   Alcohol use: Yes    Alcohol/week: 2.0 standard drinks of alcohol    Types: 2 Standard drinks or equivalent per week    Comment: 2-3 drinks per week or less   Drug use: Never   Sexual activity: Yes    Partners: Male  Other Topics Concern   Not on file  Social History Narrative   Right handed   1 cup coffee/day   1-2 sodas daily or less   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)   Has been married and divorced twice, lives with [name]   2 sons   2004- Will   2007- [name]   Completed some college   Enjoys gardening, games, reading   2 cats and 2 dogs, 2 crabs   Social Determinants of Radio broadcast assistant Strain: Not on file  Food Insecurity: Not on file  Transportation Needs: Not on file  Physical Activity: Not on file  Stress: Not on file  Social Connections: Not on file  Intimate Partner Violence: Not on file    Outpatient Medications Prior to Visit  Medication Sig Dispense Refill   buPROPion (WELLBUTRIN XL) 300 MG 24 hr tablet Take 1 tablet (300 mg total) by mouth daily. 90 tablet 3   cyanocobalamin (VITAMIN B12) 1000 MCG/ML injection Inject 1 mL (1,000 mcg total) into the muscle 2 (two) times a week. 24 mL 3   diclofenac (VOLTAREN) 25 MG EC tablet Take 25 mg by mouth 2 (two) times daily.     ibuprofen (ADVIL) 800 MG tablet Take 800 mg by mouth every 8 (eight) hours as needed.     MIMVEY 1-0.5 MG tablet TAKE 1 TABLET BY MOUTH EVERY DAY 90 tablet 3   pantoprazole (PROTONIX) 40 MG tablet TAKE 1 TABLET BY MOUTH EVERY DAY 90 tablet 1   SUMAtriptan (IMITREX) 50 MG tablet Take 1 tab by mouth at start of migraine- may repeat in 2 hrs as needed. 10 tablet 5   Syringe/Needle, Disp, (SYRINGE 3CC/25GX1-1/2") 25G X 1-1/2" 3 ML MISC Use as directed 25 each 0   estradiol-norethindrone (MIMVEY) 1-0.5 MG tablet Take 1 tablet by mouth daily. 90 tablet 3   No facility-administered medications prior to visit.    Allergies  Allergen Reactions   Poison Ivy Extract Dermatitis, Itching and Rash    Review of Systems  Skin:        (+) Abnormally Shiny Right Big Toenail       Objective:    Physical Exam Constitutional:      General: She is not in acute distress.    Appearance: Normal appearance. She is not ill-appearing.  HENT:     Head: Normocephalic and atraumatic.     Right Ear: External ear normal.     Left Ear: External ear  normal.  Eyes:  Extraocular Movements: Extraocular movements intact.     Pupils: Pupils are equal, round, and reactive to light.  Cardiovascular:     Rate and Rhythm: Normal rate and regular rhythm.     Heart sounds: Normal heart sounds. No murmur heard.    No gallop.  Pulmonary:     Effort: Pulmonary effort is normal. No respiratory distress.     Breath sounds: Normal breath sounds. No wheezing or rales.  Skin:    General: Skin is warm and dry.     Comments: Discoloration of right distal great toenail  Neurological:     Mental Status: She is alert and oriented to person, place, and time.  Psychiatric:        Mood and Affect: Mood normal.        Behavior: Behavior normal.        Judgment: Judgment normal.     BP (!) 137/95 (BP Location: Right Arm, Patient Position: Sitting, Cuff Size: Small)   Pulse 72   Temp 98.2 F (36.8 C) (Oral)   Resp 16   Wt 169 lb (76.7 kg)   LMP 07/24/2020   SpO2 100%   BMI 26.23 kg/m  Wt Readings from Last 3 Encounters:  12/07/21 169 lb (76.7 kg)  09/08/21 159 lb 6.4 oz (72.3 kg)  07/22/21 167 lb (75.8 kg)       Assessment & Plan:   Problem List Items Addressed This Visit       Unprioritized   Reactive depression (situational)    Stable on wellbutrin. Continue same for now. She has '150mg'$  tabs on hand and may try to cut her dose back at a later date.       Onychomycosis    She wishes to try natural treatments. Monitor.       Gastroesophageal reflux disease    Stable on PPI. Continue same.       B12 deficiency - Primary    She is taking b12 injections twice weekly. Will check follow up b12 level.       Relevant Orders   B12   No orders of the defined types were placed in this encounter.  Flu shot and shingrix #1 today. She plans to get covid shot at the pharmacy.  I, Nance Pear, NP, personally preformed the services described in this documentation.  All medical record entries made by the scribe were at my  direction and in my presence.  I have reviewed the chart and discharge instructions (if applicable) and agree that the record reflects my personal performance and is accurate and complete. 12/07/2021   I,Amber Collins,acting as a scribe for Nance Pear, NP.,have documented all relevant documentation on the behalf of Nance Pear, NP,as directed by  Nance Pear, NP while in the presence of Nance Pear, NP.    Nance Pear, NP

## 2021-12-07 NOTE — Assessment & Plan Note (Signed)
Stable on wellbutrin. Continue same for now. She has '150mg'$  tabs on hand and may try to cut her dose back at a later date.

## 2021-12-07 NOTE — Assessment & Plan Note (Signed)
She is taking b12 injections twice weekly. Will check follow up b12 level.

## 2021-12-07 NOTE — Assessment & Plan Note (Signed)
She wishes to try natural treatments. Monitor.

## 2021-12-07 NOTE — Addendum Note (Signed)
Addended by: Jiles Prows on: 12/07/2021 08:50 AM   Modules accepted: Orders

## 2021-12-07 NOTE — Assessment & Plan Note (Signed)
Stable on PPI. Continue same.  

## 2021-12-08 ENCOUNTER — Ambulatory Visit: Payer: Managed Care, Other (non HMO) | Admitting: Family

## 2022-01-23 ENCOUNTER — Other Ambulatory Visit: Payer: Self-pay | Admitting: Family Medicine

## 2022-01-31 ENCOUNTER — Encounter: Payer: Self-pay | Admitting: Family

## 2022-02-01 ENCOUNTER — Encounter: Payer: Self-pay | Admitting: Family

## 2022-02-01 ENCOUNTER — Ambulatory Visit (INDEPENDENT_AMBULATORY_CARE_PROVIDER_SITE_OTHER): Payer: Managed Care, Other (non HMO) | Admitting: Family

## 2022-02-01 VITALS — BP 142/83 | HR 77 | Temp 98.2°F | Resp 16 | Wt 172.0 lb

## 2022-02-01 DIAGNOSIS — J069 Acute upper respiratory infection, unspecified: Secondary | ICD-10-CM | POA: Insufficient documentation

## 2022-02-01 NOTE — Assessment & Plan Note (Signed)
New.  Home covid test is negative.  Flu and strep testing negative here today.  Recommended supportive measures including mucinex, nasal saline spray, fluids, rest, tylenol or motrin prn pain. Call if symptoms worsen or if symptoms are not improved in 3-4 days. Pt verbalizes understanding.

## 2022-02-01 NOTE — Progress Notes (Signed)
Subjective:   By signing my name below, I, Eugene Gavia, attest that this documentation has been prepared under the direction and in the presence of Nance Pear, NP 02/01/22   Patient ID: Susan Glover, female    DOB: 1965-02-06, 57 y.o.   MRN: [id number]  Chief Complaint  Patient presents with   Nasal Congestion    Complains of nasal congestion   Sore Throat    Complains of sore throat since 01/28/22   Cough    Complains of persistent productive cough since 1/13    HPI Patient is in today for sick visit.  Sore throat: She reports having a sore throat x4 days. She notes accompanying fatigue, nasal congestion, rhinorrhea, and an intermittently productive cough that began the following day. Her sore throat has persisted but is improved from when it started. She denies any fevers or body aches. Her symptoms are not similar to what she has experienced in the past with her episodes of strep throat. She had a negative home COVID test yesterday.  Past Medical History:  Diagnosis Date   Anxiety    hx of   B12 deficiency 10/29/2019   Chicken pox    Depression    hx of   Herpes zoster without complication 06/20/8935   Migraines    Shingles 08/06/2020    Past Surgical History:  Procedure Laterality Date   APPENDECTOMY  1990   BACK SURGERY     FOOT FRACTURE SURGERY Bilateral 2019   WISDOM TOOTH EXTRACTION      Family History  Problem Relation Age of Onset   Asthma Mother    Arthritis Mother    Depression Mother    Colon polyps Mother 54   Alcohol abuse Father    Heart disease Father        MI, brain aneurysm   Hyperlipidemia Father    COPD Father    Arthritis Sister    Depression Sister    Colon polyps Sister 46   Breast cancer Maternal Aunt    Arthritis Maternal Grandmother    Asthma Maternal Grandmother    COPD Maternal Grandmother    Alcohol abuse Maternal Grandfather    Asthma Maternal Grandfather    COPD Maternal Grandfather    Drug abuse Maternal  Grandfather    Hyperlipidemia Maternal Grandfather    Hypertension Maternal Grandfather    Asthma Paternal Grandmother    COPD Paternal Grandmother    Heart attack Paternal Grandfather    Hyperlipidemia Paternal Grandfather    Hypertension Paternal Grandfather    Alcohol abuse Brother    Asthma Son    Depression Son    Asthma Son    Depression Son    Colon cancer Neg Hx    Esophageal cancer Neg Hx    Rectal cancer Neg Hx    Stomach cancer Neg Hx     Social History   Socioeconomic History   Marital status: Divorced    Spouse name: Not on file   Number of children: Not on file   Years of education: Not on file   Highest education level: Not on file  Occupational History   Not on file  Tobacco Use   Smoking status: Former    Types: Cigarettes    Start date: 01/27/1985    Quit date: 01/28/2012    Years since quitting: 10.0   Smokeless tobacco: Never  Vaping Use   Vaping Use: Never used  Substance and Sexual Activity   Alcohol use:  Yes    Alcohol/week: 2.0 standard drinks of alcohol    Types: 2 Standard drinks or equivalent per week    Comment: 2-3 drinks per week or less   Drug use: Never   Sexual activity: Yes    Partners: Male  Other Topics Concern   Not on file  Social History Narrative   Right handed   1 cup coffee/day   1-2 sodas daily or less   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)   Has been married and divorced twice, lives with [name]   2 sons   2004- Will   2007- [name]   Completed some college   Enjoys gardening, games, reading   2 cats and 2 dogs, 2 crabs   Social Determinants of Radio broadcast assistant Strain: Not on file  Food Insecurity: Not on file  Transportation Needs: Not on file  Physical Activity: Not on file  Stress: Not on file  Social Connections: Not on file  Intimate Partner Violence: Not on file    Outpatient Medications Prior to Visit  Medication Sig Dispense Refill   buPROPion (WELLBUTRIN XL) 150 MG 24 hr tablet  TAKE 1 TABLET BY MOUTH EVERY DAY 90 tablet 4   buPROPion (WELLBUTRIN XL) 300 MG 24 hr tablet Take 1 tablet (300 mg total) by mouth daily. 90 tablet 3   cyanocobalamin (VITAMIN B12) 1000 MCG/ML injection Inject 1 mL (1,000 mcg total) into the muscle 2 (two) times a week. 24 mL 3   diclofenac (VOLTAREN) 25 MG EC tablet Take 25 mg by mouth 2 (two) times daily.     ibuprofen (ADVIL) 800 MG tablet Take 800 mg by mouth every 8 (eight) hours as needed.     MIMVEY 1-0.5 MG tablet TAKE 1 TABLET BY MOUTH EVERY DAY 90 tablet 3   pantoprazole (PROTONIX) 40 MG tablet TAKE 1 TABLET BY MOUTH EVERY DAY 90 tablet 1   SUMAtriptan (IMITREX) 50 MG tablet Take 1 tab by mouth at start of migraine- may repeat in 2 hrs as needed. 10 tablet 5   Syringe/Needle, Disp, (SYRINGE 3CC/25GX1-1/2") 25G X 1-1/2" 3 ML MISC Use as directed 25 each 0   No facility-administered medications prior to visit.    Allergies  Allergen Reactions   Poison Ivy Extract Dermatitis, Itching and Rash    Review of Systems  Constitutional:  Positive for malaise/fatigue. Negative for fever.  HENT:  Positive for congestion and sore throat. Negative for sinus pain.   Respiratory:  Positive for cough and sputum production. Negative for shortness of breath and wheezing.   Cardiovascular:  Negative for chest pain and palpitations.  Gastrointestinal:  Negative for blood in stool, constipation, diarrhea, nausea and vomiting.  Genitourinary:  Negative for dysuria, frequency and hematuria.  Musculoskeletal:  Negative for joint pain and myalgias.       Objective:    Physical Exam Constitutional:      General: She is not in acute distress.    Appearance: Normal appearance. She is not ill-appearing.  HENT:     Head: Normocephalic and atraumatic.     Right Ear: External ear normal.     Left Ear: External ear normal.     Mouth/Throat:     Pharynx: Posterior oropharyngeal erythema present. No oropharyngeal exudate.  Eyes:     Extraocular  Movements: Extraocular movements intact.     Pupils: Pupils are equal, round, and reactive to light.  Cardiovascular:     Rate and Rhythm: Normal rate and regular  rhythm.     Heart sounds: Normal heart sounds. No murmur heard.    No gallop.  Pulmonary:     Effort: Pulmonary effort is normal. No respiratory distress.     Breath sounds: Normal breath sounds. No wheezing or rales.  Skin:    General: Skin is warm and dry.  Neurological:     Mental Status: She is alert and oriented to person, place, and time.  Psychiatric:        Judgment: Judgment normal.     BP (!) 142/83 (BP Location: Right Arm, Patient Position: Sitting, Cuff Size: Small)   Pulse 77   Temp 98.2 F (36.8 C) (Oral)   Resp 16   Wt 172 lb (78 kg)   LMP 07/24/2020   SpO2 99%   BMI 26.70 kg/m  Wt Readings from Last 3 Encounters:  02/01/22 172 lb (78 kg)  12/07/21 169 lb (76.7 kg)  09/08/21 159 lb 6.4 oz (72.3 kg)       Assessment & Plan:  There are no diagnoses linked to this encounter.   I,Alexis Herring,acting as a Education administrator for [employer], NP.,have documented all relevant documentation on the behalf of Nance Pear, NP,as directed by  Nance Pear, NP while in the presence of Nance Pear, NP.   I, Nance Pear, NP, personally preformed the services described in this documentation.  All medical record entries made by the scribe were at my direction and in my presence.  I have reviewed the chart and discharge instructions (if applicable) and agree that the record reflects my personal performance and is accurate and complete. 02/01/22   Nance Pear, NP

## 2022-02-28 ENCOUNTER — Other Ambulatory Visit: Payer: Self-pay | Admitting: Family

## 2022-03-01 ENCOUNTER — Encounter: Payer: Self-pay | Admitting: Family Medicine

## 2022-03-01 ENCOUNTER — Other Ambulatory Visit (HOSPITAL_COMMUNITY): Payer: Self-pay

## 2022-03-01 ENCOUNTER — Encounter: Payer: Self-pay | Admitting: Family

## 2022-03-01 ENCOUNTER — Other Ambulatory Visit: Payer: Self-pay | Admitting: Family

## 2022-03-01 ENCOUNTER — Other Ambulatory Visit: Payer: Self-pay

## 2022-03-01 MED ORDER — DICLOFENAC SODIUM 25 MG PO TBEC
25.0000 mg | DELAYED_RELEASE_TABLET | Freq: Two times a day (BID) | ORAL | 1 refills | Status: DC
  Filled 2022-03-01: qty 60, 30d supply, fill #0

## 2022-03-02 ENCOUNTER — Other Ambulatory Visit: Payer: Self-pay

## 2022-03-02 MED ORDER — DICLOFENAC SODIUM 75 MG PO TBEC: 75.0000 mg | DELAYED_RELEASE_TABLET | Freq: Two times a day (BID) | ORAL | 2 refills | Status: DC | PRN

## 2022-04-30 ENCOUNTER — Other Ambulatory Visit: Payer: Self-pay | Admitting: Family

## 2022-05-05 ENCOUNTER — Other Ambulatory Visit (HOSPITAL_COMMUNITY): Payer: Self-pay

## 2022-05-08 ENCOUNTER — Other Ambulatory Visit: Payer: Self-pay | Admitting: Family Medicine

## 2022-06-07 ENCOUNTER — Ambulatory Visit: Payer: Managed Care, Other (non HMO) | Admitting: Family

## 2022-06-08 NOTE — Progress Notes (Signed)
Subjective:   By signing my name below, I, Doylene Bode, attest that this documentation has been prepared under the direction and in the presence of Lemont Fillers, NP 06/14/22   Patient ID: Susan Glover, female    DOB: Apr 21, 1965, 57 y.o.   MRN: [id number]  Chief Complaint  Patient presents with   B12 deficiency    Here for follow up, doing 1000 mcg/ml twice a week    HPI Patient is in today for a 6 month follow-up.  Vitamin B-12: treated with B12 1 mL IM twice weekly. She is overall happy with this dose. Has occasional tingling in toes with this, speech pauses. Requesting recheck.  Anxiety: treated with bupropion 150 mg daily. This is helping with stress.   Acid Reflux: controlled with pantoprazole 40 mg daily. This is helping symptoms without negative side effects.  Migraine Headaches: well-controlled with sumatriptan 50 mg at symptom onset with two hour repeat.  Arthritis: treated with diclofenac 75 mg daily. This is helping her symptoms without negative side effects.  Interested in HIV/Hep C screening.  Vaccine Counseling: will receive second shingles vaccine today.  Past Medical History:  Diagnosis Date   Anxiety    hx of   B12 deficiency 10/29/2019   Chicken pox    Depression    hx of   Herpes zoster without complication 08/21/2020   Migraines    Shingles 08/06/2020    Past Surgical History:  Procedure Laterality Date   APPENDECTOMY  1990   BACK SURGERY     FOOT FRACTURE SURGERY Bilateral 2019   WISDOM TOOTH EXTRACTION      Family History  Problem Relation Age of Onset   Asthma Mother    Arthritis Mother    Depression Mother    Colon polyps Mother 69   Alcohol abuse Father    Heart disease Father        MI, brain aneurysm   Hyperlipidemia Father    COPD Father    Arthritis Sister    Depression Sister    Colon polyps Sister 76   Breast cancer Maternal Aunt    Arthritis Maternal Grandmother    Asthma Maternal Grandmother    COPD  Maternal Grandmother    Alcohol abuse Maternal Grandfather    Asthma Maternal Grandfather    COPD Maternal Grandfather    Drug abuse Maternal Grandfather    Hyperlipidemia Maternal Grandfather    Hypertension Maternal Grandfather    Asthma Paternal Grandmother    COPD Paternal Grandmother    Heart attack Paternal Grandfather    Hyperlipidemia Paternal Grandfather    Hypertension Paternal Grandfather    Alcohol abuse Brother    Asthma Son    Depression Son    Asthma Son    Depression Son    Colon cancer Neg Hx    Esophageal cancer Neg Hx    Rectal cancer Neg Hx    Stomach cancer Neg Hx     Social History   Socioeconomic History   Marital status: Divorced    Spouse name: Not on file   Number of children: Not on file   Years of education: Not on file   Highest education level: Associate degree: occupational, Scientist, product/process development, or vocational program  Occupational History   Not on file  Tobacco Use   Smoking status: Former    Types: Cigarettes    Start date: 01/27/1985    Quit date: 01/28/2012    Years since quitting: 10.3   Smokeless tobacco:  Never  Vaping Use   Vaping Use: Never used  Substance and Sexual Activity   Alcohol use: Yes    Alcohol/week: 2.0 standard drinks of alcohol    Types: 2 Standard drinks or equivalent per week    Comment: 2-3 drinks per week or less   Drug use: Never   Sexual activity: Yes    Partners: Male  Other Topics Concern   Not on file  Social History Narrative   Right handed   1 cup coffee/day   1-2 sodas daily or less   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)   Has been married and divorced twice, lives with Lisette Abu   2 sons   2004- Will   2007- Joselyn Glassman   Completed some college   Enjoys gardening, games, reading   2 cats and 2 dogs, 2 crabs   Social Determinants of Health   Financial Resource Strain: Medium Risk (06/13/2022)   Overall Financial Resource Strain (CARDIA)    Difficulty of Paying Living Expenses: Somewhat hard  Food  Insecurity: No Food Insecurity (06/13/2022)   Hunger Vital Sign    Worried About Running Out of Food in the Last Year: Never true    Ran Out of Food in the Last Year: Never true  Transportation Needs: No Transportation Needs (06/13/2022)   PRAPARE - Administrator, Civil Service (Medical): No    Lack of Transportation (Non-Medical): No  Physical Activity: Insufficiently Active (06/13/2022)   Exercise Vital Sign    Days of Exercise per Week: 3 days    Minutes of Exercise per Session: 20 min  Stress: No Stress Concern Present (06/13/2022)   [employer] of Occupational Health - Occupational Stress Questionnaire    Feeling of Stress : Only a little  Social Connections: Socially Isolated (06/13/2022)   Social Connection and Isolation Panel [NHANES]    Frequency of Communication with Friends and Family: More than three times a week    Frequency of Social Gatherings with Friends and Family: More than three times a week    Attends Religious Services: Never    Database administrator or Organizations: No    Attends Engineer, structural: Not on file    Marital Status: Divorced  Catering manager Violence: Not on file    Outpatient Medications Prior to Visit  Medication Sig Dispense Refill   cyanocobalamin (VITAMIN B12) 1000 MCG/ML injection INJECT 1 ML (1,000 MCG TOTAL) INTO THE MUSCLE EVERY 30 DAYS. 3 mL 3   diclofenac (VOLTAREN) 75 MG EC tablet Take 1 tablet (75 mg total) by mouth 2 (two) times daily as needed. 60 tablet 2   MIMVEY 1-0.5 MG tablet TAKE 1 TABLET BY MOUTH EVERY DAY 90 tablet 3   Syringe/Needle, Disp, (SYRINGE 3CC/25GX1-1/2") 25G X 1-1/2" 3 ML MISC Use as directed 25 each 0   buPROPion (WELLBUTRIN XL) 150 MG 24 hr tablet TAKE 1 TABLET BY MOUTH EVERY DAY 90 tablet 4   pantoprazole (PROTONIX) 40 MG tablet TAKE 1 TABLET BY MOUTH EVERY DAY 90 tablet 1   SUMAtriptan (IMITREX) 50 MG tablet Take 1 tab by mouth at start of migraine- may repeat in 2 hrs as needed. 10  tablet 5   buPROPion (WELLBUTRIN XL) 300 MG 24 hr tablet TAKE 1 TABLET BY MOUTH EVERY DAY 90 tablet 3   No facility-administered medications prior to visit.    Allergies  Allergen Reactions   Poison Ivy Extract Dermatitis, Itching and Rash    Review of Systems  Constitutional:  Negative for fever and malaise/fatigue.  HENT:  Negative for congestion.   Eyes:  Negative for blurred vision.  Respiratory:  Negative for cough and shortness of breath.   Cardiovascular:  Negative for chest pain, palpitations and leg swelling.  Gastrointestinal:  Negative for vomiting.  Musculoskeletal:  Negative for back pain.  Skin:  Negative for rash.  Neurological:  Positive for tingling (Toes, intermittent). Negative for loss of consciousness and headaches.       Objective:    Physical Exam Vitals and nursing note reviewed.  Constitutional:      General: She is not in acute distress.    Appearance: She is well-developed. She is not ill-appearing or toxic-appearing.  Cardiovascular:     Rate and Rhythm: Normal rate and regular rhythm. No extrasystoles are present.    Pulses: Normal pulses.     Heart sounds: Normal heart sounds, S1 normal and S2 normal. No murmur heard.    No friction rub. No gallop.  Pulmonary:     Effort: Pulmonary effort is normal.     Breath sounds: Normal breath sounds.  Musculoskeletal:     Right lower leg: No edema.     Left lower leg: No edema.  Skin:    General: Skin is warm and dry.  Neurological:     Mental Status: She is alert.     GCS: GCS eye subscore is 4. GCS verbal subscore is 5. GCS motor subscore is 6.  Psychiatric:        Speech: Speech normal.        Behavior: Behavior normal. Behavior is cooperative.     BP 125/88 (BP Location: Right Arm, Patient Position: Sitting, Cuff Size: Small)   Pulse 72   Temp 98 F (36.7 C) (Oral)   Resp 16   Wt 181 lb (82.1 kg)   LMP 07/24/2020   SpO2 98%   BMI 28.10 kg/m  Wt Readings from Last 3 Encounters:   06/14/22 181 lb (82.1 kg)  02/01/22 172 lb (78 kg)  12/07/21 169 lb (76.7 kg)       Assessment & Plan:  Migraine without status migrainosus, not intractable, unspecified migraine type Assessment & Plan: Stable. Continues imitrex.     B12 deficiency Assessment & Plan: Overall numbness has improved.  She is injecting twice a week.   Orders: -     Vitamin B12  Reactive depression (situational) Assessment & Plan: Some job stress.  Continues wellbutrin at 150mg ,  feels ok on this dose.    Encounter for hepatitis C screening test for low risk patient -     Hepatitis C antibody  Encounter for screening for HIV -     HIV Antibody (routine testing w rflx)  Hypocalcemia -     Comprehensive metabolic panel  Need for shingles vaccine -     Varicella-zoster vaccine IM  Gastroesophageal reflux disease, unspecified whether esophagitis present Assessment & Plan: Stable on pantoprazole. Continue same.    Other orders -     SUMAtriptan Succinate; Take 1 tab by mouth at start of migraine- may repeat in 2 hrs as needed.  Dispense: 10 tablet; Refill: 5 -     Pantoprazole Sodium; Take 1 tablet (40 mg total) by mouth daily.  Dispense: 90 tablet; Refill: 1 -     buPROPion HCl ER (XL); Take 1 tablet (150 mg total) by mouth daily.  Dispense: 90 tablet; Refill: 1   I,Alexander Ruley,acting as a scribe for [employer], NP.,have  documented all relevant documentation on the behalf of Lemont Fillers, NP,as directed by  Lemont Fillers, NP while in the presence of Lemont Fillers, NP.

## 2022-06-14 ENCOUNTER — Ambulatory Visit (INDEPENDENT_AMBULATORY_CARE_PROVIDER_SITE_OTHER): Payer: Managed Care, Other (non HMO) | Admitting: Family

## 2022-06-14 VITALS — BP 125/88 | HR 72 | Temp 98.0°F | Resp 16 | Wt 181.0 lb

## 2022-06-14 DIAGNOSIS — Z23 Encounter for immunization: Secondary | ICD-10-CM | POA: Diagnosis not present

## 2022-06-14 DIAGNOSIS — Z1159 Encounter for screening for other viral diseases: Secondary | ICD-10-CM | POA: Diagnosis not present

## 2022-06-14 DIAGNOSIS — E538 Deficiency of other specified B group vitamins: Secondary | ICD-10-CM

## 2022-06-14 DIAGNOSIS — G43909 Migraine, unspecified, not intractable, without status migrainosus: Secondary | ICD-10-CM

## 2022-06-14 DIAGNOSIS — Z114 Encounter for screening for human immunodeficiency virus [HIV]: Secondary | ICD-10-CM

## 2022-06-14 DIAGNOSIS — F329 Major depressive disorder, single episode, unspecified: Secondary | ICD-10-CM

## 2022-06-14 DIAGNOSIS — K219 Gastro-esophageal reflux disease without esophagitis: Secondary | ICD-10-CM

## 2022-06-14 LAB — COMPREHENSIVE METABOLIC PANEL
ALT: 14 U/L (ref 0–35)
AST: 11 U/L (ref 0–37)
Albumin: 3.9 g/dL (ref 3.5–5.2)
Alkaline Phosphatase: 51 U/L (ref 39–117)
BUN: 11 mg/dL (ref 6–23)
CO2: 26 mEq/L (ref 19–32)
Calcium: 9.2 mg/dL (ref 8.4–10.5)
Chloride: 105 mEq/L (ref 96–112)
Creatinine, Ser: 0.81 mg/dL (ref 0.40–1.20)
GFR: 82.06 mL/min (ref >60.00)
Glucose, Bld: 88 mg/dL (ref 70–99)
Potassium: 4.2 mEq/L (ref 3.5–5.1)
Sodium: 139 mEq/L (ref 135–145)
Total Bilirubin: 0.5 mg/dL (ref 0.2–1.2)
Total Protein: 6.4 g/dL (ref 6.0–8.3)

## 2022-06-14 LAB — VITAMIN B12: Vitamin B-12: 576 pg/mL (ref 211–911)

## 2022-06-14 MED ORDER — PANTOPRAZOLE SODIUM 40 MG PO TBEC: 40.0000 mg | DELAYED_RELEASE_TABLET | Freq: Every day | ORAL | 1 refills | Status: DC

## 2022-06-14 MED ORDER — BUPROPION HCL ER (XL) 150 MG PO TB24: 150.0000 mg | ORAL_TABLET | Freq: Every day | ORAL | 1 refills | Status: DC

## 2022-06-14 MED ORDER — SUMATRIPTAN SUCCINATE 50 MG PO TABS: ORAL_TABLET | ORAL | 5 refills | Status: AC

## 2022-06-14 NOTE — Assessment & Plan Note (Signed)
Stable on pantoprazole. Continue same.  

## 2022-06-14 NOTE — Assessment & Plan Note (Signed)
Overall numbness has improved.  She is injecting twice a week.

## 2022-06-14 NOTE — Assessment & Plan Note (Signed)
Some job stress.  Continues wellbutrin at 150mg ,  feels ok on this dose.

## 2022-06-14 NOTE — Assessment & Plan Note (Signed)
Stable. Continues imitrex.

## 2022-06-15 ENCOUNTER — Encounter: Payer: Self-pay | Admitting: Family

## 2022-06-15 LAB — HEPATITIS C ANTIBODY: Hepatitis C Ab: NONREACTIVE

## 2022-06-15 LAB — HIV ANTIBODY (ROUTINE TESTING W REFLEX): HIV 1&2 Ab, 4th Generation: NONREACTIVE

## 2022-09-15 ENCOUNTER — Other Ambulatory Visit: Payer: Self-pay | Admitting: Family

## 2022-10-11 ENCOUNTER — Other Ambulatory Visit: Payer: Self-pay | Admitting: Family Medicine

## 2022-10-11 DIAGNOSIS — N951 Menopausal and female climacteric states: Secondary | ICD-10-CM

## 2022-11-08 ENCOUNTER — Telehealth: Payer: Managed Care, Other (non HMO) | Admitting: Physician Assistant

## 2022-11-08 DIAGNOSIS — J069 Acute upper respiratory infection, unspecified: Secondary | ICD-10-CM | POA: Diagnosis not present

## 2022-11-08 MED ORDER — BENZONATATE 100 MG PO CAPS
100.0000 mg | ORAL_CAPSULE | Freq: Three times a day (TID) | ORAL | 0 refills | Status: DC | PRN
Start: 2022-11-08 — End: 2022-11-22

## 2022-11-08 MED ORDER — FLUTICASONE PROPIONATE 50 MCG/ACT NA SUSP
2.0000 | Freq: Every day | NASAL | 0 refills | Status: DC
Start: 2022-11-08 — End: 2022-11-22

## 2022-11-08 NOTE — Progress Notes (Signed)
I have spent 5 minutes in review of e-visit questionnaire, review and updating patient chart, medical decision making and response to patient.   Mia Milan Cody Jacklynn Dehaas, PA-C    

## 2022-11-08 NOTE — Progress Notes (Signed)
E-Visit for Upper Respiratory Infection   We are sorry you are not feeling well.  Here is how we plan to help!  Based on what you have shared with me, it looks like you may have a viral upper respiratory infection.  Upper respiratory infections are caused by a large number of viruses; however, rhinovirus is the most common cause.   Symptoms vary from person to person, with common symptoms including sore throat, cough, fatigue or lack of energy and feeling of general discomfort.  A low-grade fever of up to 100.4 may present, but is often uncommon.  Symptoms vary however, and are closely related to a person's age or underlying illnesses.  The most common symptoms associated with an upper respiratory infection are nasal discharge or congestion, cough, sneezing, headache and pressure in the ears and face.  These symptoms usually persist for about 3 to 10 days, but can last up to 2 weeks.  It is important to know that upper respiratory infections do not cause serious illness or complications in most cases.    Upper respiratory infections can be transmitted from person to person, with the most common method of transmission being a person's hands.  The virus is able to live on the skin and can infect other persons for up to 2 hours after direct contact.  Also, these can be transmitted when someone coughs or sneezes; thus, it is important to cover the mouth to reduce this risk.  To keep the spread of the illness at bay, good hand hygiene is very important.  This is an infection that is most likely caused by a virus. There are no specific treatments other than to help you with the symptoms until the infection runs its course.  We are sorry you are not feeling well.  Here is how we plan to help!   For nasal congestion, you may use an oral decongestants such as Mucinex D or if you have glaucoma or high blood pressure use plain Mucinex.  Saline nasal spray or nasal drops can help and can safely be used as often as  needed for congestion.  For your congestion, I have prescribed Fluticasone nasal spray one spray in each nostril twice a day  If you do not have a history of heart disease, hypertension, diabetes or thyroid disease, prostate/bladder issues or glaucoma, you may also use Sudafed to treat nasal congestion.  It is highly recommended that you consult with a pharmacist or your primary care physician to ensure this medication is safe for you to take.     If you have a cough, you may use cough suppressants such as Delsym and Robitussin.  If you have glaucoma or high blood pressure, you can also use Coricidin HBP.   For cough I have prescribed for you A prescription cough medication called Tessalon Perles 100 mg. You may take 1-2 capsules every 8 hours as needed for cough  If you have a sore or scratchy throat, use a saltwater gargle-  to  teaspoon of salt dissolved in a 4-ounce to 8-ounce glass of warm water.  Gargle the solution for approximately 15-30 seconds and then spit.  It is important not to swallow the solution.  You can also use throat lozenges/cough drops and Chloraseptic spray to help with throat pain or discomfort.  Warm or cold liquids can also be helpful in relieving throat pain.  For headache, pain or general discomfort, you can use Ibuprofen or Tylenol as directed.   Some authorities believe   that zinc sprays or the use of Echinacea may shorten the course of your symptoms.  Providers prescribe antibiotics to treat infections caused by bacteria. Antibiotics are very powerful in treating bacterial infections when they are used properly. To maintain their effectiveness, they should be used only when necessary. Overuse of antibiotics has resulted in the development of superbugs that are resistant to treatment!    After careful review of your answers, I would not recommend an antibiotic for your condition.  Antibiotics are not effective against viruses and therefore should not be used to treat  them. Common examples of infections caused by viruses include colds and flu   HOME CARE Only take medications as instructed by your medical team. Be sure to drink plenty of fluids. Water is fine as well as fruit juices, sodas and electrolyte beverages. You may want to stay away from caffeine or alcohol. If you are nauseated, try taking small sips of liquids. How do you know if you are getting enough fluid? Your urine should be a pale yellow or almost colorless. Get rest. Taking a steamy shower or using a humidifier may help nasal congestion and ease sore throat pain. You can place a towel over your head and breathe in the steam from hot water coming from a faucet. Using a saline nasal spray works much the same way. Cough drops, hard candies and sore throat lozenges may ease your cough. Avoid close contacts especially the very young and the elderly Cover your mouth if you cough or sneeze Always remember to wash your hands.   GET HELP RIGHT AWAY IF: You develop worsening fever. If your symptoms do not improve within 10 days You develop yellow or green discharge from your nose over 3 days. You have coughing fits You develop a severe head ache or visual changes. You develop shortness of breath, difficulty breathing or start having chest pain Your symptoms persist after you have completed your treatment plan  MAKE SURE YOU  Understand these instructions. Will watch your condition. Will get help right away if you are not doing well or get worse.  Thank you for choosing an e-visit.  Your e-visit answers were reviewed by a board certified advanced clinical practitioner to complete your personal care plan. Depending upon the condition, your plan could have included both over the counter or prescription medications.  Please review your pharmacy choice. Make sure the pharmacy is open so you can pick up prescription now. If there is a problem, you may contact your provider through MyChart  messaging and have the prescription routed to another pharmacy.  Your safety is important to us. If you have drug allergies check your prescription carefully.   For the next 24 hours you can use MyChart to ask questions about today's visit, request a non-urgent call back, or ask for a work or school excuse. You will get an email in the next two days asking about your experience. I hope that your e-visit has been valuable and will speed your recovery.     

## 2022-11-10 IMAGING — MG MM DIGITAL SCREENING BILAT W/ TOMO AND CAD
8 series · 8 of 24 positions shown · non-contrast
Comparison: Previous exam(s).

CLINICAL DATA: Screening.

EXAM:
DIGITAL SCREENING BILATERAL MAMMOGRAM WITH TOMOSYNTHESIS AND CAD
TECHNIQUE: Bilateral screening digital craniocaudal and mediolateral oblique
mammograms were obtained. Bilateral screening digital breast
tomosynthesis was performed. The images were evaluated with
computer-aided detection.

[R CC synth-2D]
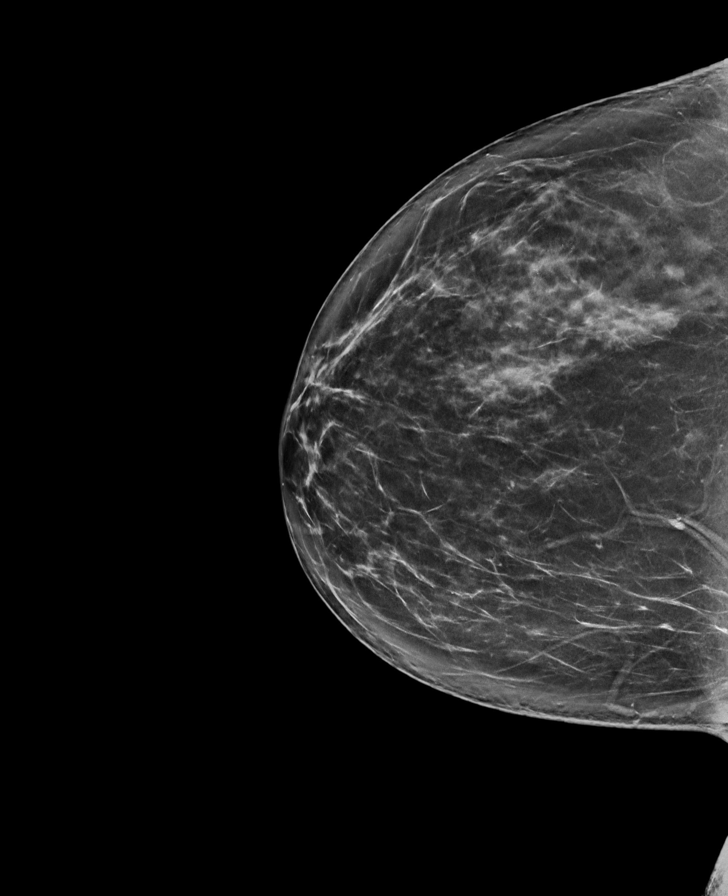

[R MLO synth-2D]
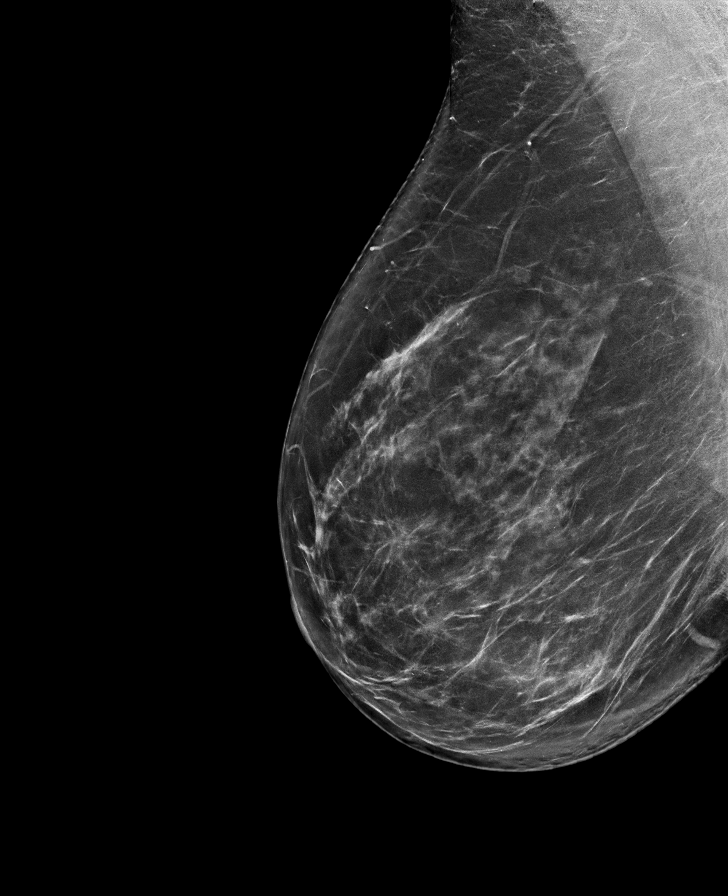

[L CC synth-2D]
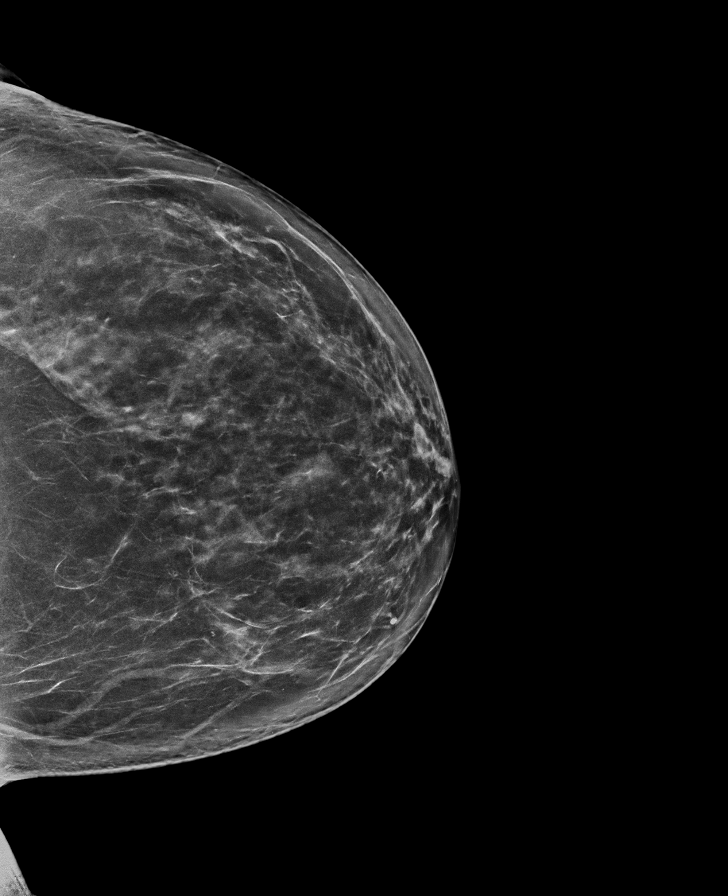

[L MLO synth-2D]
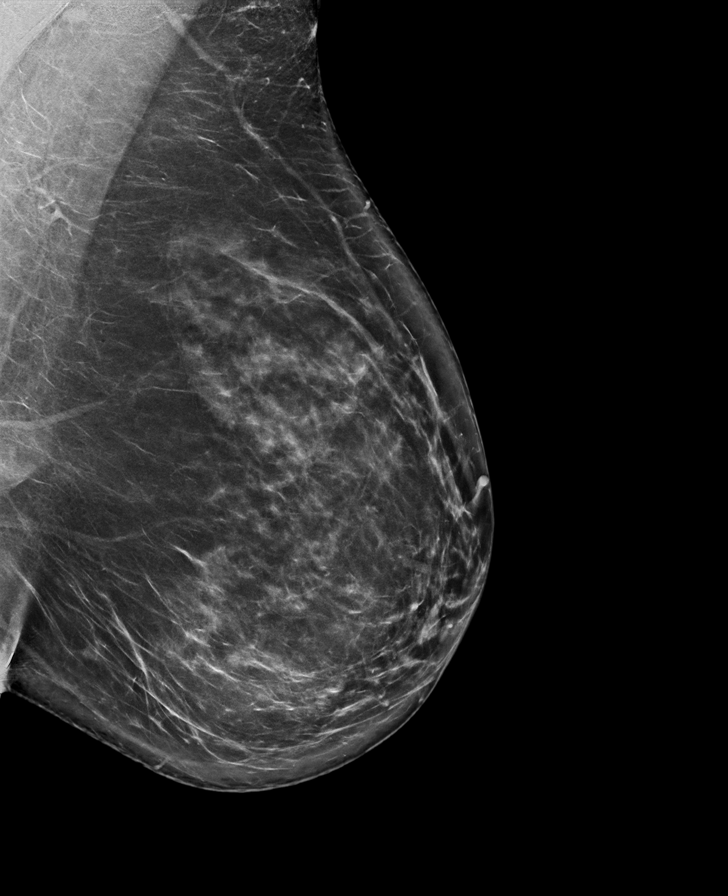

[L CC tomo · tomo slice 45/89.0]
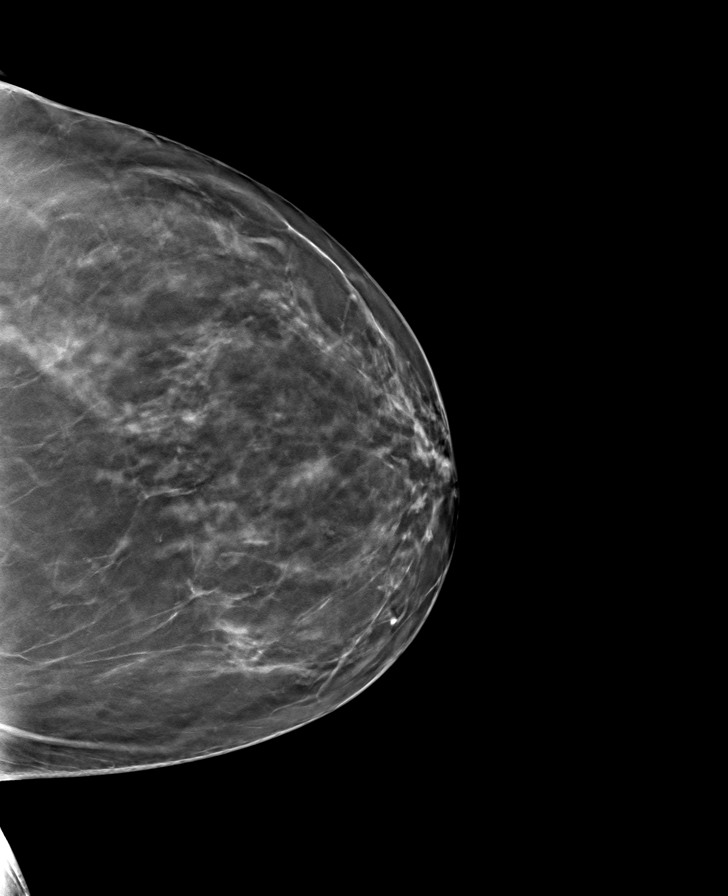

[R CC tomo · tomo slice 43/86.0]
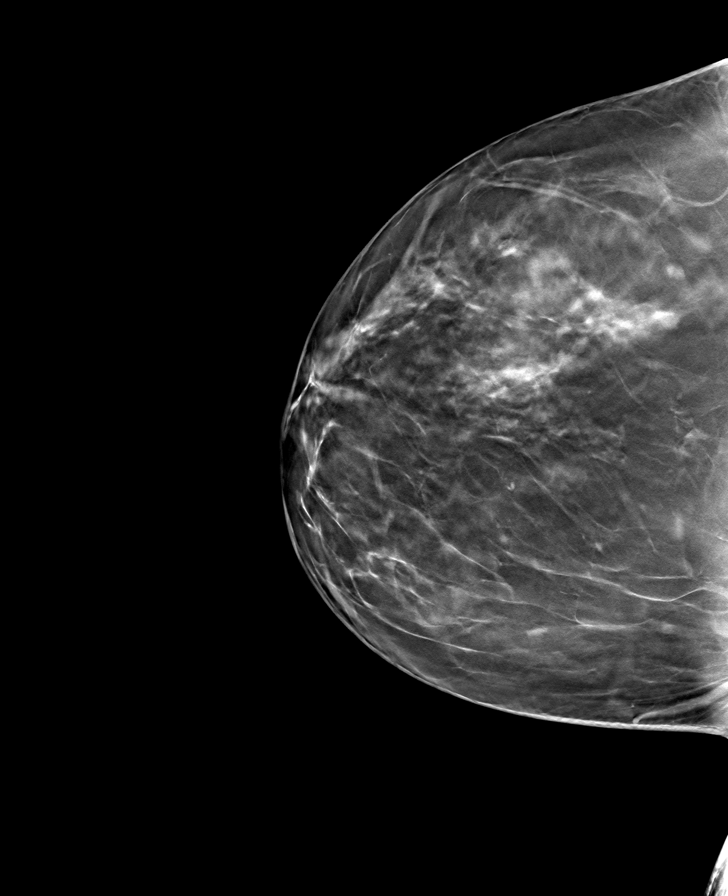

[L MLO tomo · tomo slice 46/91.0]
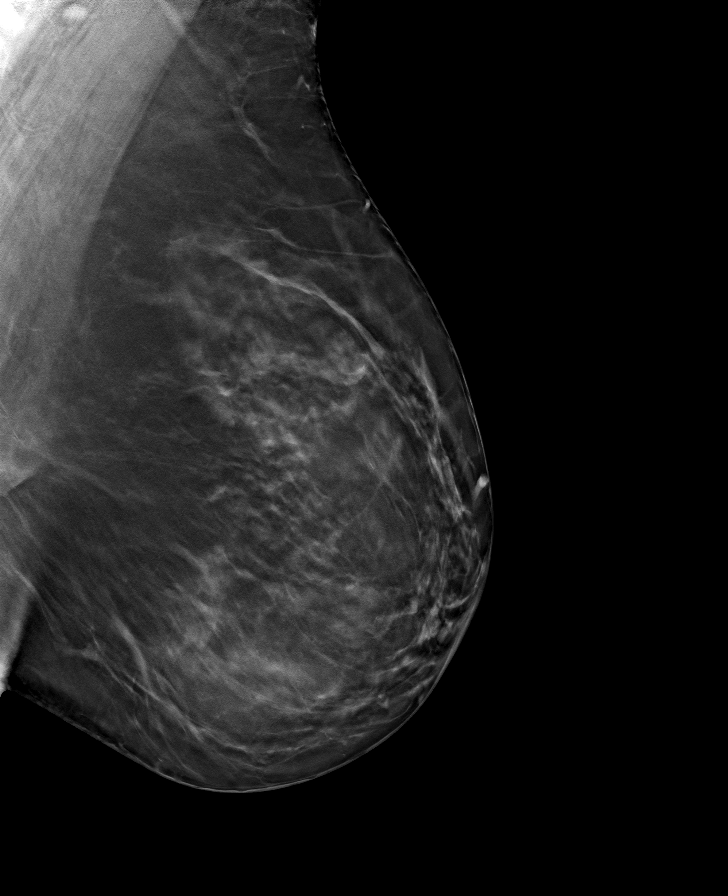

[R MLO tomo · tomo slice 46/91.0]
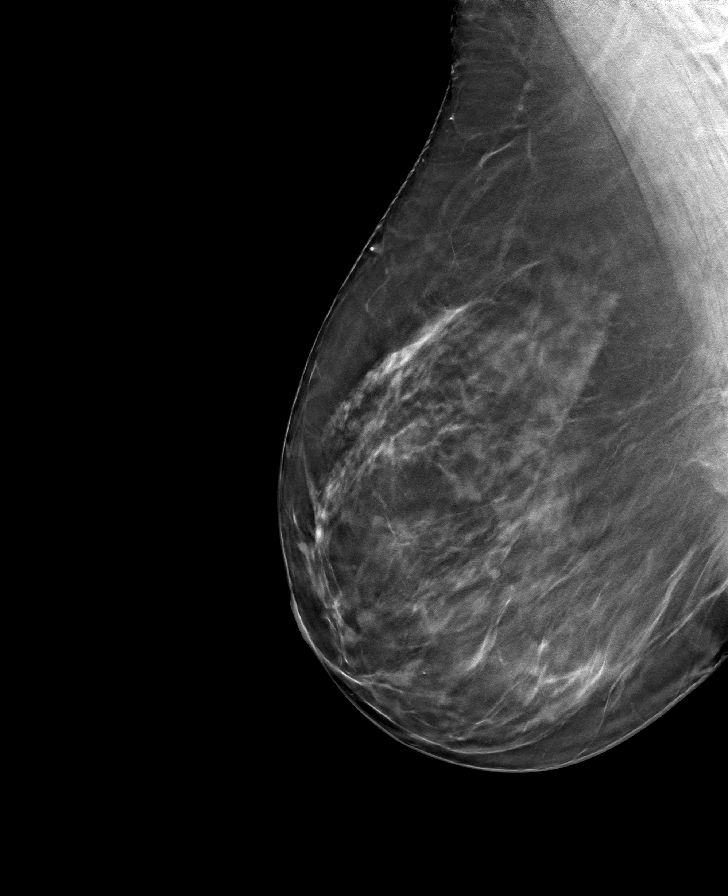

[8 of 24 positions shown; findings below may reference images not displayed]

ACR Breast Density Category c: The breast tissue is heterogeneously
dense, which may obscure small masses.
FINDINGS: There are no findings suspicious for malignancy.
IMPRESSION: No mammographic evidence of malignancy. A result letter of this
screening mammogram will be mailed directly to the patient.

RECOMMENDATION:
Screening mammogram in one year. (Code:Q3-W-BC3)

BI-RADS CATEGORY  1: Negative.

## 2022-11-21 ENCOUNTER — Encounter: Payer: Self-pay | Admitting: Family

## 2022-11-22 MED ORDER — DICLOFENAC SODIUM 75 MG PO TBEC: 75.0000 mg | DELAYED_RELEASE_TABLET | Freq: Two times a day (BID) | ORAL | 2 refills | Status: DC | PRN

## 2023-02-17 ENCOUNTER — Other Ambulatory Visit: Payer: Self-pay | Admitting: Family

## 2023-03-01 ENCOUNTER — Ambulatory Visit: Payer: Managed Care, Other (non HMO) | Admitting: Family

## 2023-03-01 ENCOUNTER — Encounter: Payer: Self-pay | Admitting: Family

## 2023-03-01 VITALS — BP 126/69 | HR 74 | Temp 98.5°F | Resp 16 | Ht 67.3 in | Wt 186.0 lb

## 2023-03-01 DIAGNOSIS — F329 Major depressive disorder, single episode, unspecified: Secondary | ICD-10-CM | POA: Diagnosis not present

## 2023-03-01 DIAGNOSIS — D510 Vitamin B12 deficiency anemia due to intrinsic factor deficiency: Secondary | ICD-10-CM

## 2023-03-01 DIAGNOSIS — M5432 Sciatica, left side: Secondary | ICD-10-CM | POA: Diagnosis not present

## 2023-03-01 DIAGNOSIS — G43909 Migraine, unspecified, not intractable, without status migrainosus: Secondary | ICD-10-CM | POA: Diagnosis not present

## 2023-03-01 DIAGNOSIS — Z1231 Encounter for screening mammogram for malignant neoplasm of breast: Secondary | ICD-10-CM

## 2023-03-01 LAB — VITAMIN B12: Vitamin B-12: 412 pg/mL (ref 211–911)

## 2023-03-01 MED ORDER — METHYLPREDNISOLONE 4 MG PO TBPK: ORAL_TABLET | ORAL | 0 refills | Status: DC

## 2023-03-01 MED ORDER — CYCLOBENZAPRINE HCL 10 MG PO TABS: 10.0000 mg | ORAL_TABLET | Freq: Three times a day (TID) | ORAL | 0 refills | Status: AC | PRN

## 2023-03-01 MED ORDER — BUPROPION HCL ER (XL) 150 MG PO TB24: 150.0000 mg | ORAL_TABLET | Freq: Every day | ORAL | 2 refills | Status: DC

## 2023-03-01 NOTE — Progress Notes (Signed)
Subjective:     Patient ID: Susan Glover, female    DOB: 1965-02-08, 58 y.o.   MRN: [id number]  Chief Complaint  Patient presents with   Depression    Patient doing well, will like to come off medication in the next few months    B12 deficiency    Doing once a month IM B12 at home, last administration 2 weeks ago    HPI  Discussed the use of AI scribe software for clinical note transcription with the patient, who gave verbal consent to proceed.  History of Present Illness   Susan Glover "Susan Glover" is a 58 year old female who presents for a routine follow-up for chronic back pain.  She experiences chronic back pain radiating down her leg, affecting her knee and foot, and is associated with hip pain and arthritis on the same side. The pain is severe, often bringing her to tears after walking a lap around a mall. She has previously received steroid injections in [place], which provided some relief, and has tried various treatments including massage, acupuncture, acupressure, chiropractic care, and physical therapy before considering surgery.  She is currently taking diclofenac twice a day, Advil, turmeric in the morning, and cyclobenzaprine 10 mg, which she takes on weekends due to its sedative effects. She is also taking pantoprazole and Wellbutrin, with plans to discontinue the latter in the spring. She administers B12 injections at home every 30 days.  She engages in daily stretching exercises, including yoga, and stretches at her desk during the day to manage her symptoms. At the end of the day, she often needs to lie down with an ice pack to alleviate the pain.  No recent migraines.         Health Maintenance Due  Topic Date Due   DTaP/Tdap/Td (1 - Tdap) Never done   INFLUENZA VACCINE  08/18/2022   COVID-19 Vaccine (4 - 2024-25 season) 09/18/2022   MAMMOGRAM  11/20/2022    Past Medical History:  Diagnosis Date   Anxiety    hx of   B12 deficiency 10/29/2019   Chicken  pox    Depression    hx of   Herpes zoster without complication 08/21/2020   Migraines    Shingles 08/06/2020    Past Surgical History:  Procedure Laterality Date   APPENDECTOMY  1990   BACK SURGERY     FOOT FRACTURE SURGERY Bilateral 2019   WISDOM TOOTH EXTRACTION      Family History  Problem Relation Age of Onset   Asthma Mother    Arthritis Mother    Depression Mother    Colon polyps Mother 74   Alcohol abuse Father    Heart disease Father        MI, brain aneurysm   Hyperlipidemia Father    COPD Father    Arthritis Sister    Depression Sister    Colon polyps Sister 62   Breast cancer Maternal Aunt    Arthritis Maternal Grandmother    Asthma Maternal Grandmother    COPD Maternal Grandmother    Alcohol abuse Maternal Grandfather    Asthma Maternal Grandfather    COPD Maternal Grandfather    Drug abuse Maternal Grandfather    Hyperlipidemia Maternal Grandfather    Hypertension Maternal Grandfather    Asthma Paternal Grandmother    COPD Paternal Grandmother    Heart attack Paternal Grandfather    Hyperlipidemia Paternal Grandfather    Hypertension Paternal Grandfather    Alcohol abuse Brother  Asthma Son    Depression Son    Asthma Son    Depression Son    Colon cancer Neg Hx    Esophageal cancer Neg Hx    Rectal cancer Neg Hx    Stomach cancer Neg Hx     Social History   Socioeconomic History   Marital status: Divorced    Spouse name: Not on file   Number of children: Not on file   Years of education: Not on file   Highest education level: Associate degree: occupational, Scientist, product/process development, or vocational program  Occupational History   Not on file  Tobacco Use   Smoking status: Former    Current packs/day: 0.00    Types: Cigarettes    Start date: 01/27/1985    Quit date: 01/28/2012    Years since quitting: 11.0   Smokeless tobacco: Never  Vaping Use   Vaping status: Never Used  Substance and Sexual Activity   Alcohol use: Yes    Alcohol/week: 2.0  standard drinks of alcohol    Types: 2 Standard drinks or equivalent per week    Comment: 2-3 drinks per week or less   Drug use: Never   Sexual activity: Yes    Partners: Male  Other Topics Concern   Not on file  Social History Narrative   Right handed   1 cup coffee/day   1-2 sodas daily or less   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)   Has been married and divorced twice, lives with Lisette Abu   2 sons   2004- Will   2007- Joselyn Glassman   Completed some college   Enjoys gardening, games, reading   2 cats and 2 dogs, 2 crabs   Social Drivers of Corporate investment banker Strain: Medium Risk (06/13/2022)   Overall Financial Resource Strain (CARDIA)    Difficulty of Paying Living Expenses: Somewhat hard  Food Insecurity: No Food Insecurity (06/13/2022)   Hunger Vital Sign    Worried About Running Out of Food in the Last Year: Never true    Ran Out of Food in the Last Year: Never true  Transportation Needs: No Transportation Needs (06/13/2022)   PRAPARE - Administrator, Civil Service (Medical): No    Lack of Transportation (Non-Medical): No  Physical Activity: Insufficiently Active (06/13/2022)   Exercise Vital Sign    Days of Exercise per Week: 3 days    Minutes of Exercise per Session: 20 min  Stress: No Stress Concern Present (06/13/2022)   [employer] of Occupational Health - Occupational Stress Questionnaire    Feeling of Stress : Only a little  Social Connections: Socially Isolated (06/13/2022)   Social Connection and Isolation Panel [NHANES]    Frequency of Communication with Friends and Family: More than three times a week    Frequency of Social Gatherings with Friends and Family: More than three times a week    Attends Religious Services: Never    Database administrator or Organizations: No    Attends Engineer, structural: Not on file    Marital Status: Divorced  Catering manager Violence: Not on file    Outpatient Medications Prior to Visit   Medication Sig Dispense Refill   cyanocobalamin (VITAMIN B12) 1000 MCG/ML injection Inject 1 mL (1,000 mcg total) into the muscle every 30 (thirty) days. 3 mL 3   diclofenac (VOLTAREN) 75 MG EC tablet Take 1 tablet (75 mg total) by mouth 2 (two) times daily as needed. 60 tablet 2  estradiol-norethindrone (ACTIVELLA) 1-0.5 MG tablet TAKE 1 TABLET BY MOUTH EVERY DAY 84 tablet 3   pantoprazole (PROTONIX) 40 MG tablet Take 1 tablet (40 mg total) by mouth daily. 30 tablet 0   SUMAtriptan (IMITREX) 50 MG tablet Take 1 tab by mouth at start of migraine- may repeat in 2 hrs as needed. 10 tablet 5   Syringe/Needle, Disp, (SYRINGE 3CC/25GX1-1/2") 25G X 1-1/2" 3 ML MISC Use as directed 25 each 0   buPROPion (WELLBUTRIN XL) 150 MG 24 hr tablet Take 1 tablet (150 mg total) by mouth daily. 30 tablet 0   No facility-administered medications prior to visit.    Allergies  Allergen Reactions   Poison Ivy Extract Dermatitis, Itching and Rash    ROS See HPI    Objective:    Physical Exam Constitutional:      General: She is not in acute distress.    Appearance: Normal appearance. She is well-developed.  HENT:     Head: Normocephalic and atraumatic.     Right Ear: External ear normal.     Left Ear: External ear normal.  Eyes:     General: No scleral icterus. Neck:     Thyroid: No thyromegaly.  Cardiovascular:     Rate and Rhythm: Normal rate and regular rhythm.     Heart sounds: Normal heart sounds. No murmur heard. Pulmonary:     Effort: Pulmonary effort is normal. No respiratory distress.     Breath sounds: Normal breath sounds. No wheezing.  Musculoskeletal:     Cervical back: Neck supple.  Skin:    General: Skin is warm and dry.  Neurological:     Mental Status: She is alert and oriented to person, place, and time.  Psychiatric:        Mood and Affect: Mood normal.        Behavior: Behavior normal.        Thought Content: Thought content normal.        Judgment: Judgment normal.       BP 126/69 (BP Location: Right Arm, Patient Position: Sitting, Cuff Size: Normal)   Pulse 74   Temp 98.5 F (36.9 C) (Oral)   Resp 16   Ht 5' 7.3" (1.709 m)   Wt 186 lb (84.4 kg)   LMP 07/24/2020   SpO2 100%   BMI 28.87 kg/m  Wt Readings from Last 3 Encounters:  03/01/23 186 lb (84.4 kg)  06/14/22 181 lb (82.1 kg)  02/01/22 172 lb (78 kg)       Assessment & Plan:   Problem List Items Addressed This Visit       Unprioritized   Vitamin B12 deficiency anemia due to intrinsic factor deficiency - Primary   Continues b12 injections at home.       Sciatica of left side    Chronic issue with pain radiating down the leg, suggestive of sciatica. Current management with diclofenac, ibuprofen, and cyclobenzaprine is not providing adequate relief. Previous interventions include steroid injections and laminectomy. -Discontinue concurrent use of diclofenac and ibuprofen due to redundancy and potential harm to stomach and kidneys. -Consider using either diclofenac or ibuprofen with Tylenol, if tolerated. -Prescribe Medrol dose pack for short-term relief. -If no improvement, refer back to Dr. Dutch Quint for further management, potentially including another round of steroid injections or surgical intervention.      Relevant Medications   cyclobenzaprine (FLEXERIL) 10 MG tablet   buPROPion (WELLBUTRIN XL) 150 MG 24 hr tablet   Reactive depression (situational)  Patient is on Wellbutrin, considering discontinuing in the spring. -Prescribe 30 tabs of Wellbutrin with refills. Patient to inform provider if she decides to discontinue.      Relevant Medications   buPROPion (WELLBUTRIN XL) 150 MG 24 hr tablet   Migraine without status migrainosus, not intractable   No recent migraines Keeps Imitrex on hand for prn use.       Relevant Medications   cyclobenzaprine (FLEXERIL) 10 MG tablet   buPROPion (WELLBUTRIN XL) 150 MG 24 hr tablet   Other Visit Diagnoses       Breast cancer  screening by mammogram       Relevant Orders   B12   MM 3D SCREENING MAMMOGRAM BILATERAL BREAST       I am having Susan C. Dutch Quint "Susan Glover" start on methylPREDNISolone and cyclobenzaprine. I am also having her maintain her SYRINGE 3CC/25GX1-1/2", SUMAtriptan, cyanocobalamin, estradiol-norethindrone, diclofenac, pantoprazole, and buPROPion.  Meds ordered this encounter  Medications   methylPREDNISolone (MEDROL DOSEPAK) 4 MG TBPK tablet    Sig: Take per package instructions.    Dispense:  21 tablet    Refill:  0    Supervising Provider:   Danise Edge A [[id number]]   cyclobenzaprine (FLEXERIL) 10 MG tablet    Sig: Take 1 tablet (10 mg total) by mouth 3 (three) times daily as needed for muscle spasms.    Dispense:  30 tablet    Refill:  0    Supervising Provider:   Danise Edge A [[id number]]   buPROPion (WELLBUTRIN XL) 150 MG 24 hr tablet    Sig: Take 1 tablet (150 mg total) by mouth daily.    Dispense:  30 tablet    Refill:  2    Requested drug refills are authorized, however, the patient needs further evaluation and/or laboratory testing before further refills are given. Ask her to make an appointment for this.    Supervising Provider:   Danise Edge A [phone]

## 2023-03-01 NOTE — Assessment & Plan Note (Signed)
No recent migraines Keeps Imitrex on hand for prn use.

## 2023-03-01 NOTE — Patient Instructions (Signed)
VISIT SUMMARY:  Today, we discussed your chronic back pain, vitamin B12 deficiency, preventive care, and depression management. We reviewed your current medications and made some adjustments to better manage your symptoms.  YOUR PLAN:  -LOWER BACK PAIN: Lower back pain that radiates down the leg, often referred to as sciatica, can be very painful and affect daily activities. We recommend stopping the use of both diclofenac and ibuprofen together due to potential harm to your stomach and kidneys. Instead, you can use either diclofenac or ibuprofen with Tylenol if it is tolerated. We have prescribed a Medrol dose pack for short-term relief. If your pain does not improve, we will consider referring you back to Dr. Dutch Quint for further management, which may include another round of steroid injections or surgery.  -VITAMIN B12 DEFICIENCY: Vitamin B12 deficiency can cause fatigue and other symptoms. You are currently managing this with monthly B12 injections at home. We will check your B12 levels today to ensure they are within the normal range.  -PREVENTIVE CARE: Preventive care is important for maintaining overall health. We have ordered a mammogram for you and advise you to schedule it soon. Additionally, we recommend getting a flu shot as soon as possible due to the high prevalence of flu. We will also schedule a physical for your next visit, during which you will receive a tetanus shot.  -DEPRESSION: Depression is a condition that affects your mood and overall well-being. You are currently taking Wellbutrin and are considering discontinuing it in the spring. We have prescribed 30 tablets of Wellbutrin with refills. Please inform us if you decide to discontinue the medication.  INSTRUCTIONS:  Please schedule your mammogram and get a flu shot at your earliest convenience. We will check your B12 levels today. If your back pain does not improve with the new medication plan, please contact us for a possible  referral back to Dr. Dutch Quint. We will also schedule a physical for your next visit, during which you will receive a tetanus shot.

## 2023-03-01 NOTE — Assessment & Plan Note (Signed)
  Chronic issue with pain radiating down the leg, suggestive of sciatica. Current management with diclofenac, ibuprofen, and cyclobenzaprine is not providing adequate relief. Previous interventions include steroid injections and laminectomy. -Discontinue concurrent use of diclofenac and ibuprofen due to redundancy and potential harm to stomach and kidneys. -Consider using either diclofenac or ibuprofen with Tylenol, if tolerated. -Prescribe Medrol dose pack for short-term relief. -If no improvement, refer back to Dr. Dutch Quint for further management, potentially including another round of steroid injections or surgical intervention.

## 2023-03-01 NOTE — Assessment & Plan Note (Signed)
Continues b12 injections at home.

## 2023-03-01 NOTE — Assessment & Plan Note (Signed)
  Patient is on Wellbutrin, considering discontinuing in the spring. -Prescribe 30 tabs of Wellbutrin with refills. Patient to inform provider if she decides to discontinue.

## 2023-03-02 ENCOUNTER — Other Ambulatory Visit: Payer: Self-pay | Admitting: Family

## 2023-03-08 ENCOUNTER — Encounter: Payer: Managed Care, Other (non HMO) | Admitting: Family

## 2023-03-22 ENCOUNTER — Encounter (HOSPITAL_BASED_OUTPATIENT_CLINIC_OR_DEPARTMENT_OTHER): Payer: Self-pay

## 2023-03-22 ENCOUNTER — Ambulatory Visit (HOSPITAL_BASED_OUTPATIENT_CLINIC_OR_DEPARTMENT_OTHER)
Admission: RE | Admit: 2023-03-22 | Discharge: 2023-03-22 | Disposition: A | Discharge status: Home / Self Care | Payer: Managed Care, Other (non HMO) | Source: Ambulatory Visit | Attending: Family | Admitting: Family

## 2023-03-22 ENCOUNTER — Encounter: Payer: Managed Care, Other (non HMO) | Admitting: Family

## 2023-03-22 DIAGNOSIS — Z1231 Encounter for screening mammogram for malignant neoplasm of breast: Secondary | ICD-10-CM

## 2023-03-30 ENCOUNTER — Other Ambulatory Visit: Payer: Self-pay | Admitting: Family

## 2023-06-13 ENCOUNTER — Encounter: Payer: Self-pay | Admitting: Family

## 2023-06-25 ENCOUNTER — Other Ambulatory Visit: Payer: Self-pay | Admitting: Family

## 2023-07-14 ENCOUNTER — Ambulatory Visit (INDEPENDENT_AMBULATORY_CARE_PROVIDER_SITE_OTHER): Admitting: Family

## 2023-07-14 ENCOUNTER — Encounter: Payer: Self-pay | Admitting: Family

## 2023-07-14 ENCOUNTER — Other Ambulatory Visit (HOSPITAL_COMMUNITY)
Admission: RE | Admit: 2023-07-14 | Discharge: 2023-07-14 | Disposition: A | Discharge status: Home / Self Care | Source: Ambulatory Visit | Attending: Family | Admitting: Family

## 2023-07-14 ENCOUNTER — Telehealth: Payer: Self-pay | Admitting: Family

## 2023-07-14 VITALS — BP 144/81 | HR 85 | Temp 98.7°F | Resp 16 | Ht 67.3 in | Wt 183.0 lb

## 2023-07-14 DIAGNOSIS — K219 Gastro-esophageal reflux disease without esophagitis: Secondary | ICD-10-CM

## 2023-07-14 DIAGNOSIS — R053 Chronic cough: Secondary | ICD-10-CM

## 2023-07-14 DIAGNOSIS — Z0000 Encounter for general adult medical examination without abnormal findings: Secondary | ICD-10-CM | POA: Diagnosis not present

## 2023-07-14 DIAGNOSIS — R221 Localized swelling, mass and lump, neck: Secondary | ICD-10-CM | POA: Insufficient documentation

## 2023-07-14 DIAGNOSIS — D510 Vitamin B12 deficiency anemia due to intrinsic factor deficiency: Secondary | ICD-10-CM | POA: Diagnosis not present

## 2023-07-14 DIAGNOSIS — Z01419 Encounter for gynecological examination (general) (routine) without abnormal findings: Secondary | ICD-10-CM

## 2023-07-14 DIAGNOSIS — R030 Elevated blood-pressure reading, without diagnosis of hypertension: Secondary | ICD-10-CM | POA: Insufficient documentation

## 2023-07-14 DIAGNOSIS — Z113 Encounter for screening for infections with a predominantly sexual mode of transmission: Secondary | ICD-10-CM | POA: Insufficient documentation

## 2023-07-14 DIAGNOSIS — M5432 Sciatica, left side: Secondary | ICD-10-CM | POA: Diagnosis not present

## 2023-07-14 DIAGNOSIS — F329 Major depressive disorder, single episode, unspecified: Secondary | ICD-10-CM | POA: Diagnosis not present

## 2023-07-14 DIAGNOSIS — E538 Deficiency of other specified B group vitamins: Secondary | ICD-10-CM

## 2023-07-14 LAB — CBC WITH DIFFERENTIAL/PLATELET
Basophils Absolute: 0 10*3/uL (ref 0.0–0.1)
Basophils Relative: 0.5 % (ref 0.0–3.0)
Eosinophils Absolute: 0.1 10*3/uL (ref 0.0–0.7)
Eosinophils Relative: 1.7 % (ref 0.0–5.0)
HCT: 39.6 % (ref 36.0–46.0)
Hemoglobin: 13.5 g/dL (ref 12.0–15.0)
Lymphocytes Relative: 28.3 % (ref 12.0–46.0)
Lymphs Abs: 1.4 10*3/uL (ref 0.7–4.0)
MCHC: 34 g/dL (ref 30.0–36.0)
MCV: 93.8 fl (ref 78.0–100.0)
Monocytes Absolute: 0.4 10*3/uL (ref 0.1–1.0)
Monocytes Relative: 7.2 % (ref 3.0–12.0)
Neutro Abs: 3.2 10*3/uL (ref 1.4–7.7)
Neutrophils Relative %: 62.3 % (ref 43.0–77.0)
Platelets: 307 10*3/uL (ref 150.0–400.0)
RBC: 4.22 Mil/uL (ref 3.87–5.11)
RDW: 12.1 % (ref 11.5–15.5)
WBC: 5.1 10*3/uL (ref 4.0–10.5)

## 2023-07-14 LAB — VITAMIN B12: Vitamin B-12: 443 pg/mL (ref 211–911)

## 2023-07-14 MED ORDER — CELECOXIB 100 MG PO CAPS: 100.0000 mg | ORAL_CAPSULE | Freq: Every day | ORAL | 0 refills | Status: DC

## 2023-07-14 NOTE — Assessment & Plan Note (Signed)
 Much improved. Pt wishes to discontinue wellbutrin . She will let me know if she has any issues coming off.

## 2023-07-14 NOTE — Assessment & Plan Note (Signed)
Continues b12 injections monthly.

## 2023-07-14 NOTE — Assessment & Plan Note (Signed)
 Mild cough following URI- will let me know if it does not improve in the coming weeks.

## 2023-07-14 NOTE — Assessment & Plan Note (Signed)
  Up to date with mammogram and colonoscopy. Considering hepatitis B vaccine, decided to wait. Lost weight since last visit without intentional changes. - Continue current health maintenance screenings. - Discuss hepatitis B vaccination in the future if desired. - continue work on weight loss. - Pap updated today.

## 2023-07-14 NOTE — Progress Notes (Signed)
 Subjective:     Patient ID: Susan Glover, female    DOB: 1965/08/28, 58 y.o.   MRN: [id number]  Chief Complaint  Patient presents with   Annual Exam    HPI  Discussed the use of AI scribe software for clinical note transcription with the patient, who gave verbal consent to proceed.  History of Present Illness  Susan Glover is a 58 year old female who presents for an annual physical exam and routine testing.  She is interested in updating her physical and performing routine tests, including vaginal swabs and blood work. She had a Pap smear in 2022 with negative high-risk HPV results.  She has a non-painful knot on her neck, which was brought to her attention by a massage therapist.  She experienced a cough for over two weeks following cold symptoms. The cough was described as 'a little itchy' and is improving. No current cough or cold symptoms are present.  She has a history of back surgery and experiences nerve pain radiating down her leg. She uses diclofenac  twice daily for arthritis but finds it less effective than desired. Meloxicam  provided no relief, and she takes ibuprofen when in pain.  She takes pantoprazole  for reflux symptoms, which she started three years ago. She recently stopped taking Wellbutrin  and is monitoring for any changes.  She administers B12 injections monthly at home and notes tingling in her feet towards the end of the month, which she associates with her sciatica. No urinary symptoms, digestive concerns, or unusual muscle or joint pain beyond her known issues.     Health Maintenance Due  Topic Date Due   DTaP/Tdap/Td (1 - Tdap) Never done   Hepatitis B Vaccines (1 of 3 - 19+ 3-dose series) Never done   COVID-19 Vaccine (4 - 2024-25 season) 09/18/2022    Past Medical History:  Diagnosis Date   Anxiety    hx of   B12 deficiency 10/29/2019   Chicken pox    Depression    hx of   Herpes zoster without complication 08/21/2020   Migraines     Shingles 08/06/2020    Past Surgical History:  Procedure Laterality Date   APPENDECTOMY  1990   BACK SURGERY     FOOT FRACTURE SURGERY Bilateral 2019   WISDOM TOOTH EXTRACTION      Family History  Problem Relation Age of Onset   Asthma Mother    Arthritis Mother    Depression Mother    Colon polyps Mother 94   Alcohol abuse Father    Heart disease Father        MI, brain aneurysm   Hyperlipidemia Father    COPD Father    Arthritis Sister    Depression Sister    Colon polyps Sister 31   Alcohol abuse Brother    Kidney cancer Brother    Arthritis Maternal Grandmother    Asthma Maternal Grandmother    COPD Maternal Grandmother    Alcohol abuse Maternal Grandfather    Asthma Maternal Grandfather    COPD Maternal Grandfather    Drug abuse Maternal Grandfather    Hyperlipidemia Maternal Grandfather    Hypertension Maternal Grandfather    Asthma Paternal Grandmother    COPD Paternal Grandmother    Heart attack Paternal Grandfather    Hyperlipidemia Paternal Grandfather    Hypertension Paternal Grandfather    Asthma Son    Depression Son    Asthma Son    Depression Son    Breast  cancer Maternal Aunt    Colon cancer Neg Hx    Esophageal cancer Neg Hx    Rectal cancer Neg Hx    Stomach cancer Neg Hx     Social History   Socioeconomic History   Marital status: Divorced    Spouse name: Not on file   Number of children: Not on file   Years of education: Not on file   Highest education level: Some college, no degree  Occupational History   Not on file  Tobacco Use   Smoking status: Former    Current packs/day: 0.00    Types: Cigarettes    Start date: 01/27/1985    Quit date: 01/28/2012    Years since quitting: 11.4   Smokeless tobacco: Never  Vaping Use   Vaping status: Never Used  Substance and Sexual Activity   Alcohol use: Yes    Alcohol/week: 2.0 standard drinks of alcohol    Types: 2 Standard drinks or equivalent per week    Comment: 2-3 drinks per  week or less   Drug use: Never   Sexual activity: Yes    Partners: Male  Other Topics Concern   Not on file  Social History Narrative   Right handed   1 cup coffee/day   1-2 sodas daily or less   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   Has been married and divorced twice, currently single   2 sons   2004- Will   2007- Norman   Completed some college   Enjoys gardening, games, reading   2 cats and 2 dogs, 2 crabs   Social Drivers of Corporate investment banker Strain: Low Risk  (07/13/2023)   Overall Financial Resource Strain (CARDIA)    Difficulty of Paying Living Expenses: Not hard at all  Food Insecurity: No Food Insecurity (07/13/2023)   Hunger Vital Sign    Worried About Running Out of Food in the Last Year: Never true    Ran Out of Food in the Last Year: Never true  Transportation Needs: No Transportation Needs (07/13/2023)   PRAPARE - Administrator, Civil Service (Medical): No    Lack of Transportation (Non-Medical): No  Physical Activity: Insufficiently Active (07/13/2023)   Exercise Vital Sign    Days of Exercise per Week: 2 days    Minutes of Exercise per Session: 20 min  Stress: No Stress Concern Present (07/13/2023)   [employer] of Occupational Health - Occupational Stress Questionnaire    Feeling of Stress: Not at all  Social Connections: Moderately Isolated (07/13/2023)   Social Connection and Isolation Panel    Frequency of Communication with Friends and Family: More than three times a week    Frequency of Social Gatherings with Friends and Family: Three times a week    Attends Religious Services: Never    Active Member of Clubs or Organizations: Yes    Attends Engineer, structural: More than 4 times per year    Marital Status: Divorced  Catering manager Violence: Not on file    Outpatient Medications Prior to Visit  Medication Sig Dispense Refill   cyanocobalamin  (VITAMIN B12) 1000 MCG/ML injection INJECT 1 ML (1,000  MCG) INTRAMUSCULARLY EVERY 30 DAYS 3 mL 3   cyclobenzaprine  (FLEXERIL ) 10 MG tablet Take 1 tablet (10 mg total) by mouth 3 (three) times daily as needed for muscle spasms. 30 tablet 0   estradiol -norethindrone  (ACTIVELLA) 1-0.5 MG tablet TAKE 1 TABLET BY MOUTH EVERY DAY 84 tablet  3   SUMAtriptan  (IMITREX ) 50 MG tablet Take 1 tab by mouth at start of migraine- may repeat in 2 hrs as needed. 10 tablet 5   Syringe/Needle, Disp, (SYRINGE 3CC/25GX1-1/2) 25G X 1-1/2 3 ML MISC Use as directed 25 each 0   buPROPion  (WELLBUTRIN  XL) 150 MG 24 hr tablet Take 1 tablet (150 mg total) by mouth daily. 30 tablet 2   diclofenac  (VOLTAREN ) 75 MG EC tablet TAKE 1 TABLET BY MOUTH 2 TIMES DAILY AS NEEDED. 60 tablet 2   methylPREDNISolone  (MEDROL  DOSEPAK) 4 MG TBPK tablet Take per package instructions. 21 tablet 0   pantoprazole  (PROTONIX ) 40 MG tablet Take 1 tablet (40 mg total) by mouth daily. 30 tablet 0   No facility-administered medications prior to visit.    Allergies  Allergen Reactions   Poison Ivy Extract Dermatitis, Itching and Rash    Review of Systems  Constitutional:  Positive for weight loss.  HENT:  Negative for congestion and hearing loss.   Eyes:  Negative for blurred vision.  Respiratory:  Positive for cough (for 2 weeks, recent uri).   Cardiovascular:  Negative for leg swelling.  Gastrointestinal:  Negative for constipation and diarrhea.  Genitourinary:  Negative for dysuria and frequency.  Musculoskeletal:  Negative for joint pain and myalgias.  Skin:  Negative for rash.       Objective:    Physical Exam   BP (!) 144/81 (BP Location: Right Arm, Patient Position: Sitting, Cuff Size: Normal)   Pulse 85   Temp 98.7 F (37.1 C) (Oral)   Resp 16   Ht 5' 7.3 (1.709 m)   Wt 183 lb (83 kg)   LMP 07/24/2020   SpO2 98%   BMI 28.41 kg/m  Wt Readings from Last 3 Encounters:  07/14/23 183 lb (83 kg)  03/01/23 186 lb (84.4 kg)  06/14/22 181 lb (82.1 kg)  Physical Exam   Constitutional: She is oriented to person, place, and time. She appears well-developed and well-nourished. No distress.  HENT:  Head: Normocephalic and atraumatic.  Right Ear: Tympanic membrane and ear canal normal.  Left Ear: Tympanic membrane and ear canal normal.  Mouth/Throat: Oropharynx is clear and moist.  Eyes: Pupils are equal, round, and reactive to light. No scleral icterus.  Neck: Normal range of motion. No thyromegaly present.  Cardiovascular: Normal rate and regular rhythm.   No murmur heard. Pulmonary/Chest: Effort normal and breath sounds normal. No respiratory distress. He has no wheezes. She has no rales. She exhibits no tenderness.  Abdominal: Soft. Bowel sounds are normal. She exhibits no distension and no mass. There is no tenderness. There is no rebound and no guarding.  Musculoskeletal: She exhibits no edema. There is a rubbery semi-mobile mass noted adjacent to left side of base of C-spine around C6- C7.  Lymphadenopathy:    She has no cervical adenopathy.  Neurological: She is alert and oriented to person, place, and time. She has normal patellar reflexes. She exhibits normal muscle tone. Coordination normal.  Skin: Skin is warm and dry.  Psychiatric: She has a normal mood and affect. Her behavior is normal. Judgment and thought content normal.  Breasts: Examined lying Right: Without masses, retractions, discharge or axillary adenopathy.  Left: Without masses, retractions, discharge or axillary adenopathy.  Inguinal/mons: Normal without inguinal adenopathy  External genitalia: Normal  BUS/Urethra/Skene's glands: Normal  Bladder: Normal  Vagina: Normal  Cervix: Normal  Uterus: normal in size, shape and contour. Midline and mobile  Adnexa/parametria:  Rt: Without masses  or tenderness.  Lt: Without masses or tenderness.  Anus and perineum: Normal            Assessment & Plan:        Assessment & Plan:   Problem List Items Addressed This Visit        Unprioritized   Vitamin B12 deficiency anemia due to intrinsic factor deficiency   Continues b12 injections monthly.       Relevant Orders   B12   CBC w/Diff   Sciatica of left side   Uncontrolled.  Will give trial of celebrex. If no improvement, consider medrol  dose pak.       Relevant Medications   celecoxib (CELEBREX) 100 MG capsule   Reactive depression (situational)   Much improved. Pt wishes to discontinue wellbutrin . She will let me know if she has any issues coming off.       Preventative health care - Primary    Up to date with mammogram and colonoscopy. Considering hepatitis B vaccine, decided to wait. Lost weight since last visit without intentional changes. - Continue current health maintenance screenings. - Discuss hepatitis B vaccination in the future if desired. - continue work on weight loss. - Pap updated today.      Persistent dry cough   Mild cough following URI- will let me know if it does not improve in the coming weeks.      Mass in neck   Suspect lipoma- obtain US  for further evaluation.       Relevant Orders   US  MiscellaneoUS Localization   GERD (gastroesophageal reflux disease)   Stable- wishes to trial off of pantoprazole .       Elevated blood pressure reading   BP Readings from Last 3 Encounters:  07/14/23 (!) 144/81  03/01/23 126/69  06/14/22 125/88   Mild elevation- repeat in 1 month.      B12 deficiency   Continues b12 injections at home.       Other Visit Diagnoses       Screening examination for sexually transmitted disease       Relevant Orders   Cervicovaginal ancillary only( [facility])   HepB+HepC+HIV Panel   RPR     Encounter for routine gynecological examination with Papanicolaou smear of cervix       Relevant Orders   Cytology - PAP( [facility])       I have discontinued Adylee C. Borelli Kathy's pantoprazole , methylPREDNISolone , buPROPion , and diclofenac . I am also having her start on celecoxib.  Additionally, I am having her maintain her SYRINGE 3CC/25GX1-1/2, SUMAtriptan , estradiol -norethindrone , cyclobenzaprine , and cyanocobalamin .  Meds ordered this encounter  Medications   celecoxib (CELEBREX) 100 MG capsule    Sig: Take 1 capsule (100 mg total) by mouth daily.    Dispense:  30 capsule    Refill:  0    Supervising Provider:   DOMENICA BLACKBIRD A [[id number]]

## 2023-07-14 NOTE — Telephone Encounter (Signed)
 See mychart.

## 2023-07-14 NOTE — Assessment & Plan Note (Signed)
 BP Readings from Last 3 Encounters:  07/14/23 (!) 144/81  03/01/23 126/69  06/14/22 125/88   Mild elevation- repeat in 1 month.

## 2023-07-14 NOTE — Assessment & Plan Note (Signed)
 Suspect lipoma- obtain US  for further evaluation.

## 2023-07-14 NOTE — Assessment & Plan Note (Signed)
 Continues b12 injections at home.

## 2023-07-14 NOTE — Assessment & Plan Note (Signed)
 Uncontrolled.  Will give trial of celebrex. If no improvement, consider medrol  dose pak.

## 2023-07-14 NOTE — Patient Instructions (Signed)
 VISIT SUMMARY:  Today, you had your annual physical exam and routine tests. We discussed several health concerns, including a neck mass, chronic cough, back pain with sciatica, GERD, depression, and vitamin B12 deficiency. We also updated your routine health screenings and discussed future vaccinations.  YOUR PLAN:  ROUTINE Engineer, water AND STI SCREENING: You wanted to update your physical and perform routine tests, including vaginal swabs and blood work. -We will perform vaginal swabs and blood work for STI screening. -We will update your Pap smear and run an HPV test.  NECK MASS: You have a non-painful knot on your neck that was noticed by a massage therapist. -We will refer you to a specialist for evaluation of the neck mass.  CHRONIC COUGH: You had a cough for over two weeks following cold symptoms, which is now improving. -Monitor your cough for a couple more weeks and let us  know if it does not improve.  CHRONIC BACK PAIN WITH SCIATICA: You have chronic back pain with sciatica, likely related to previous surgery. Current medications are not providing adequate relief. -We will prescribe Celebrex for your arthritis pain. -If Celebrex is ineffective, we may consider a steroid pack. -Monitor for side effects such as kidney irritation and stomach ulcers.  GASTROESOPHAGEAL REFLUX DISEASE (GERD): You are considering discontinuing pantoprazole  to see if you still need it. -Discontinue pantoprazole  and monitor for recurrence of reflux symptoms.  DEPRESSION: You recently stopped taking Wellbutrin  and are monitoring for any mood changes. -Monitor for any issues after stopping Wellbutrin . -If necessary, consider tapering with 75 mg tablets.  VITAMIN B12 DEFICIENCY: You are on monthly home B12 injections and experience tingling in your feet towards the end of the month. -We will check your B12 levels to ensure your supplementation is adequate.  GENERAL HEALTH MAINTENANCE: You are up to date  with your mammogram and colonoscopy. We discussed the hepatitis B vaccine and your recent weight loss. -Continue with your current health maintenance screenings. -We can discuss the hepatitis B vaccination in the future if you desire.

## 2023-07-14 NOTE — Assessment & Plan Note (Signed)
 Stable- wishes to trial off of pantoprazole .

## 2023-07-15 ENCOUNTER — Ambulatory Visit: Payer: Self-pay | Admitting: Family

## 2023-07-15 LAB — RPR: RPR Ser Ql: NONREACTIVE

## 2023-07-15 LAB — HEPB+HEPC+HIV PANEL
HIV Screen 4th Generation wRfx: NONREACTIVE
Hep B C IgM: NEGATIVE
Hep B Core Total Ab: NEGATIVE
Hep B E Ab: NONREACTIVE
Hep B E Ag: NEGATIVE
Hep B Surface Ab, Qual: NONREACTIVE
Hep C Virus Ab: NONREACTIVE
Hepatitis B Surface Ag: NEGATIVE

## 2023-07-18 LAB — CERVICOVAGINAL ANCILLARY ONLY
Chlamydia: NEGATIVE
Comment: NEGATIVE
Comment: NEGATIVE
Comment: NORMAL
Neisseria Gonorrhea: NEGATIVE
Trichomonas: NEGATIVE

## 2023-07-18 LAB — CYTOLOGY - PAP
Adequacy: ABSENT
Comment: NEGATIVE
Diagnosis: NEGATIVE
High risk HPV: NEGATIVE

## 2023-08-02 ENCOUNTER — Other Ambulatory Visit: Payer: Self-pay | Admitting: Family

## 2023-08-02 DIAGNOSIS — R221 Localized swelling, mass and lump, neck: Secondary | ICD-10-CM

## 2023-08-02 NOTE — Addendum Note (Signed)
 Addended by: DARYL SETTER on: 08/02/2023 11:05 AM   Modules accepted: Orders

## 2023-08-07 ENCOUNTER — Other Ambulatory Visit: Payer: Self-pay | Admitting: Family

## 2023-08-07 ENCOUNTER — Encounter: Payer: Self-pay | Admitting: Family

## 2023-08-07 DIAGNOSIS — M5432 Sciatica, left side: Secondary | ICD-10-CM

## 2023-08-07 MED ORDER — CELECOXIB 100 MG PO CAPS: 100.0000 mg | ORAL_CAPSULE | Freq: Every day | ORAL | 2 refills | Status: DC

## 2023-08-17 ENCOUNTER — Ambulatory Visit (HOSPITAL_BASED_OUTPATIENT_CLINIC_OR_DEPARTMENT_OTHER)
Admission: RE | Admit: 2023-08-17 | Discharge: 2023-08-17 | Disposition: A | Discharge status: Home / Self Care | Source: Ambulatory Visit | Attending: Family | Admitting: Family

## 2023-08-17 DIAGNOSIS — R221 Localized swelling, mass and lump, neck: Secondary | ICD-10-CM | POA: Diagnosis not present

## 2023-08-17 DIAGNOSIS — M542 Cervicalgia: Secondary | ICD-10-CM | POA: Diagnosis not present

## 2023-08-22 ENCOUNTER — Ambulatory Visit: Payer: Self-pay | Admitting: Family

## 2023-10-04 ENCOUNTER — Other Ambulatory Visit: Payer: Self-pay | Admitting: Family Medicine

## 2023-10-04 DIAGNOSIS — N951 Menopausal and female climacteric states: Secondary | ICD-10-CM

## 2023-10-25 ENCOUNTER — Encounter: Payer: Self-pay | Admitting: Family

## 2023-11-01 ENCOUNTER — Encounter: Payer: Self-pay | Admitting: Family

## 2023-11-01 DIAGNOSIS — N951 Menopausal and female climacteric states: Secondary | ICD-10-CM

## 2023-11-01 MED ORDER — ESTRADIOL-NORETHINDRONE ACET 1-0.5 MG PO TABS: 1.0000 | ORAL_TABLET | Freq: Every day | ORAL | 0 refills | Status: DC

## 2023-11-24 ENCOUNTER — Other Ambulatory Visit: Payer: Self-pay | Admitting: Family

## 2023-11-24 DIAGNOSIS — N951 Menopausal and female climacteric states: Secondary | ICD-10-CM

## 2023-11-26 ENCOUNTER — Other Ambulatory Visit: Payer: Self-pay | Admitting: Family

## 2023-11-26 DIAGNOSIS — M5432 Sciatica, left side: Secondary | ICD-10-CM

## 2023-11-29 ENCOUNTER — Encounter: Payer: Self-pay | Admitting: Family

## 2023-11-29 DIAGNOSIS — M5432 Sciatica, left side: Secondary | ICD-10-CM

## 2023-11-29 DIAGNOSIS — K219 Gastro-esophageal reflux disease without esophagitis: Secondary | ICD-10-CM

## 2023-11-29 MED ORDER — PANTOPRAZOLE SODIUM 40 MG PO TBEC: 40.0000 mg | DELAYED_RELEASE_TABLET | Freq: Every day | ORAL | 1 refills | Status: AC

## 2023-11-30 MED ORDER — CELECOXIB 100 MG PO CAPS: 100.0000 mg | ORAL_CAPSULE | Freq: Every day | ORAL | 2 refills | Status: AC

## 2023-11-30 NOTE — Addendum Note (Signed)
 Addended by: DARYL SETTER on: 11/30/2023 02:01 PM   Modules accepted: Orders
